# Patient Record
Sex: Female | Born: 1945 | Race: White | Hispanic: No | Marital: Married | Smoking: Never smoker
Health system: Southern US, Community
[De-identification: ages and names within clinical notes are randomized; demographics above are authoritative.]

## PROBLEM LIST (undated history)

## (undated) DIAGNOSIS — M1990 Unspecified osteoarthritis, unspecified site: Secondary | ICD-10-CM

## (undated) DIAGNOSIS — M8580 Other specified disorders of bone density and structure, unspecified site: Secondary | ICD-10-CM

## (undated) DIAGNOSIS — E079 Disorder of thyroid, unspecified: Secondary | ICD-10-CM

## (undated) DIAGNOSIS — F419 Anxiety disorder, unspecified: Secondary | ICD-10-CM

## (undated) DIAGNOSIS — K5790 Diverticulosis of intestine, part unspecified, without perforation or abscess without bleeding: Secondary | ICD-10-CM

## (undated) DIAGNOSIS — I10 Essential (primary) hypertension: Secondary | ICD-10-CM

## (undated) DIAGNOSIS — E785 Hyperlipidemia, unspecified: Secondary | ICD-10-CM

## (undated) DIAGNOSIS — K589 Irritable bowel syndrome without diarrhea: Secondary | ICD-10-CM

## (undated) HISTORY — DX: Hyperlipidemia, unspecified: E78.5

## (undated) HISTORY — DX: Irritable bowel syndrome, unspecified: K58.9

## (undated) HISTORY — DX: Anxiety disorder, unspecified: F41.9

## (undated) HISTORY — DX: Unspecified osteoarthritis, unspecified site: M19.90

## (undated) HISTORY — DX: Essential (primary) hypertension: I10

## (undated) HISTORY — DX: Other specified disorders of bone density and structure, unspecified site: M85.80

## (undated) HISTORY — DX: Disorder of thyroid, unspecified: E07.9

## (undated) HISTORY — DX: Diverticulosis of intestine, part unspecified, without perforation or abscess without bleeding: K57.90

---

## 2000-07-03 ENCOUNTER — Encounter: Payer: Self-pay | Admitting: Family Medicine

## 2000-07-03 ENCOUNTER — Encounter: Admission: RE | Admit: 2000-07-03 | Discharge: 2000-07-03 | Payer: Self-pay | Admitting: Family Medicine

## 2001-02-02 ENCOUNTER — Encounter: Payer: Self-pay | Admitting: Emergency Medicine

## 2001-02-02 ENCOUNTER — Emergency Department (HOSPITAL_COMMUNITY): Admission: EM | Admit: 2001-02-02 | Discharge: 2001-02-03 | Payer: Self-pay | Admitting: Emergency Medicine

## 2001-02-03 ENCOUNTER — Encounter: Payer: Self-pay | Admitting: Emergency Medicine

## 2001-02-27 ENCOUNTER — Encounter (INDEPENDENT_AMBULATORY_CARE_PROVIDER_SITE_OTHER): Payer: Self-pay | Admitting: *Deleted

## 2001-02-27 ENCOUNTER — Encounter: Payer: Self-pay | Admitting: Surgery

## 2001-02-27 ENCOUNTER — Observation Stay (HOSPITAL_COMMUNITY): Admission: RE | Admit: 2001-02-27 | Discharge: 2001-02-28 | Payer: Self-pay | Admitting: Surgery

## 2001-06-18 ENCOUNTER — Other Ambulatory Visit: Admission: RE | Admit: 2001-06-18 | Discharge: 2001-06-18 | Payer: Self-pay | Admitting: Family Medicine

## 2002-10-14 HISTORY — PX: CHOLECYSTECTOMY: SHX55

## 2008-10-26 ENCOUNTER — Ambulatory Visit (HOSPITAL_COMMUNITY): Admission: RE | Admit: 2008-10-26 | Discharge: 2008-10-26 | Payer: Self-pay | Admitting: Family Medicine

## 2009-07-20 ENCOUNTER — Encounter (INDEPENDENT_AMBULATORY_CARE_PROVIDER_SITE_OTHER): Payer: Self-pay | Admitting: *Deleted

## 2009-08-28 ENCOUNTER — Other Ambulatory Visit: Admission: RE | Admit: 2009-08-28 | Discharge: 2009-08-28 | Payer: Self-pay | Admitting: Family Medicine

## 2009-08-28 LAB — HM PAP SMEAR

## 2010-05-30 IMAGING — CR DG KNEE 1-2V*R*
2 series · 2 of 2 positions shown · non-contrast
Comparison: None

CLINICAL DATA: Medial pain

RIGHT KNEE - 1-2 VIEW

[t knee ap right]
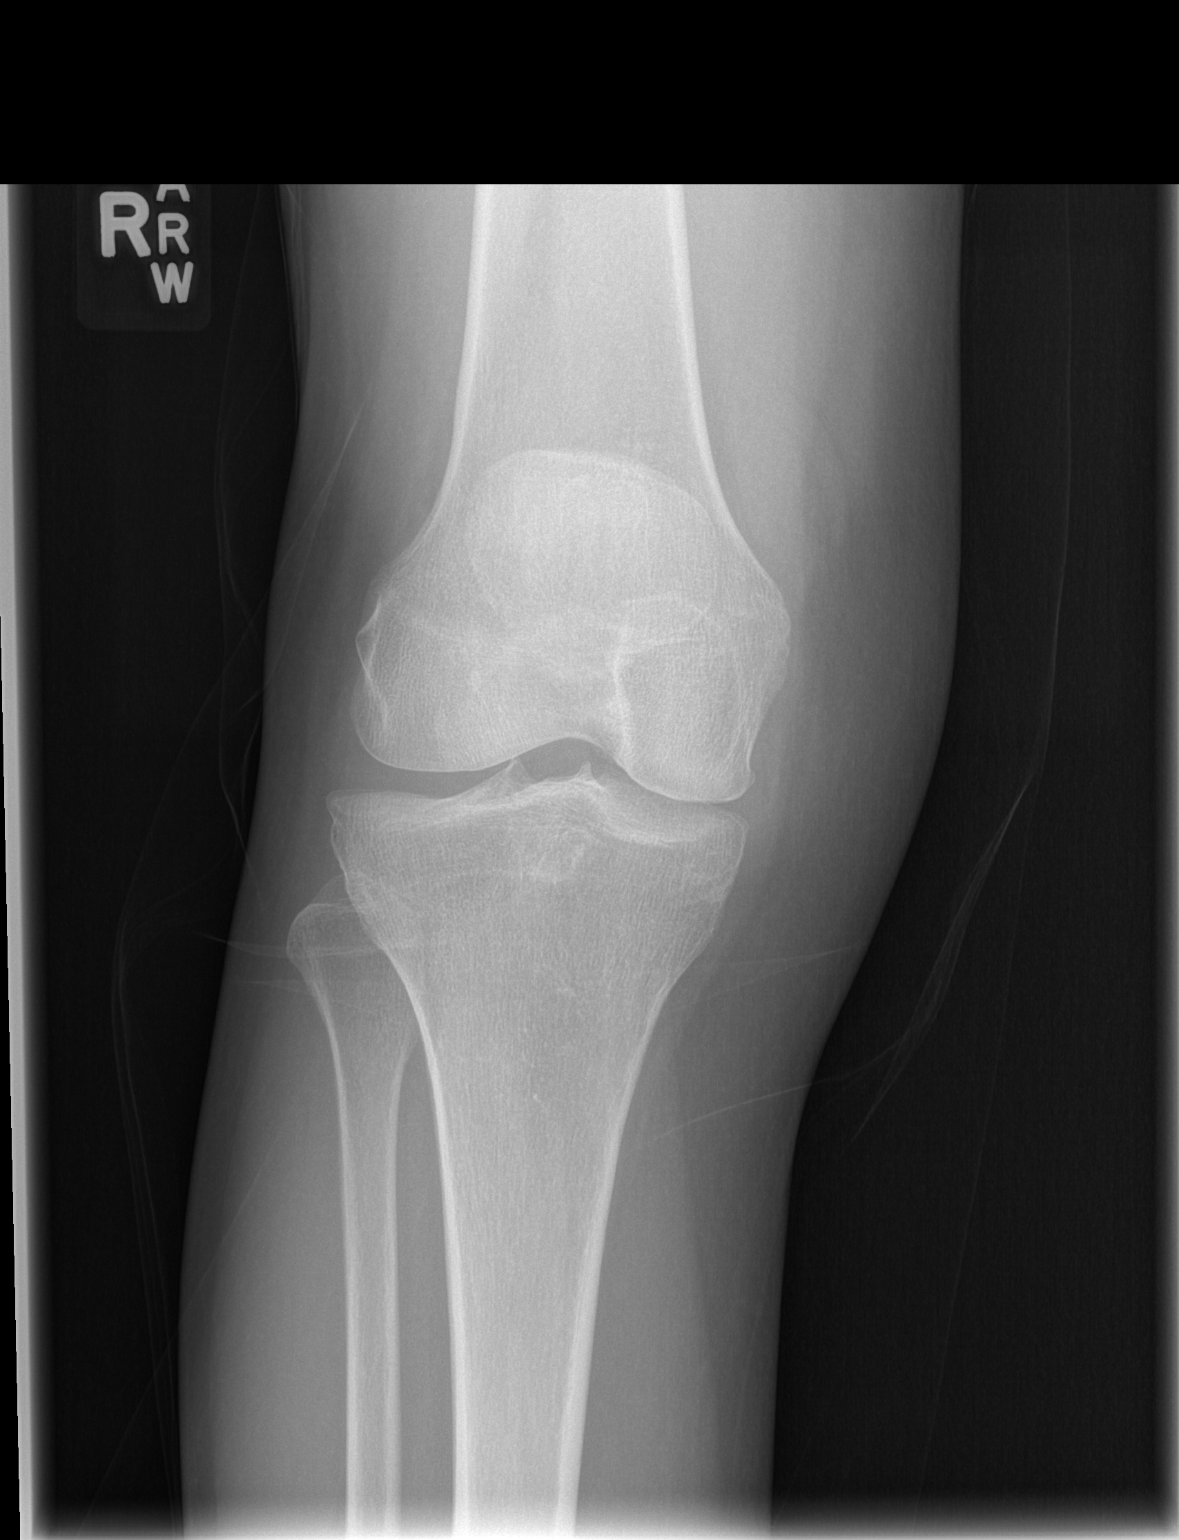

[t knee lat right]
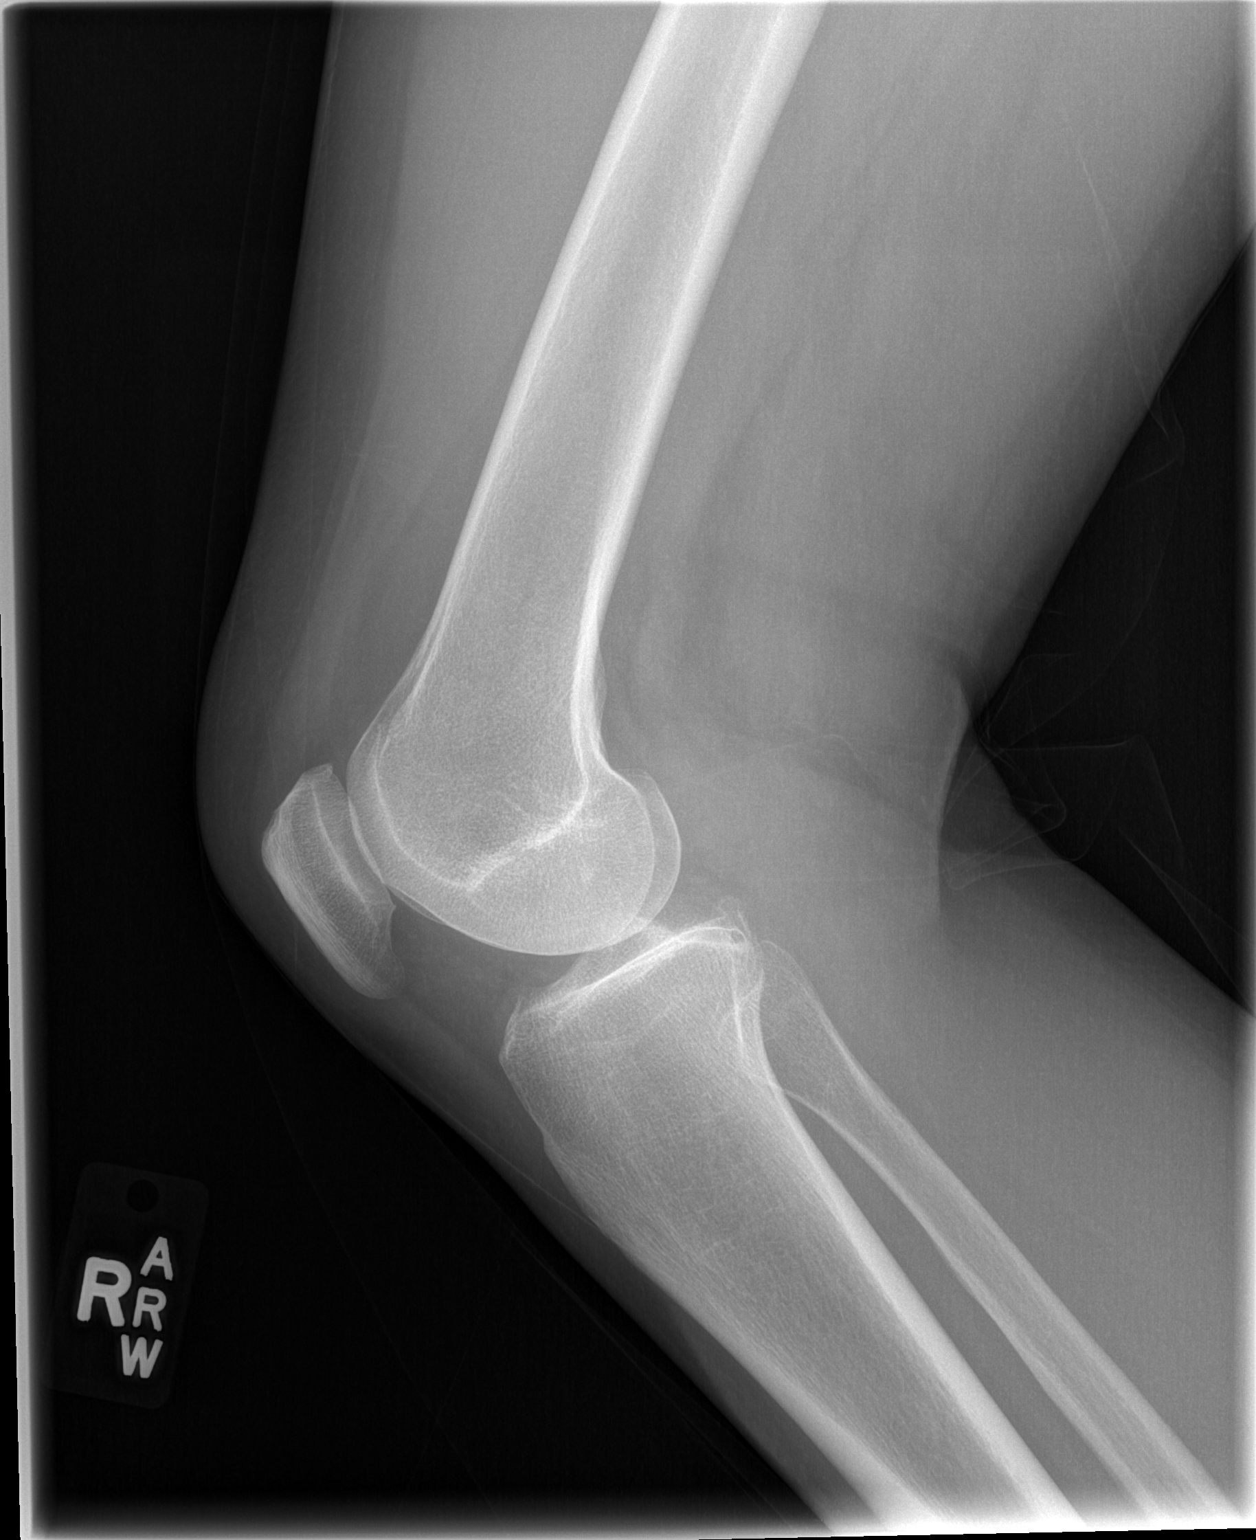

[2 of 2 positions shown; findings below may reference images not displayed]

FINDINGS: No discernible joint effusion.  There is mild medial
compartment joint space narrowing with very small osteophytes.  No
abnormalities seen elsewhere.
IMPRESSION: Mild medial compartment osteoarthritis.

## 2011-03-01 NOTE — Op Note (Signed)
[facility]  Patient:    Andrea Boyd, Andrea Boyd                   MRN: [id number] Proc. Date: 02/27/01 Adm. Date:  [id number] Disc. Date: [id number] Attending:  Katha Cabal CC:         Carolyne Fiscal, M.D.   Operative Report  CCS NUMBER:  [id number]  PREOPERATIVE INDICATIONS:  Andrea Boyd is a 65 year old lady, who was seen in the [facility] Emergency Room on February 02, 2001, complaining of pain under her breasts for about four days.  She was evaluated in the emergency department, and a gallbladder ultrasound was done which showed numerous small gallstones.  She was seen in the office by me on February 09, 2001, and we discussed laparoscopic as well as open cholecystectomy.  Informed consent was obtained, and she is brought to the operating room at this time for laparoscopic cholecystectomy.  SURGEON:  Thornton Park. Daphine Deutscher, M.D.  ASSISTANT:  Sandria Bales. Ezzard Standing, M.D.  PROCEDURE:  Laparoscopic cholecystectomy, intraoperative cholangiogram, biopsy of omental nodule.  FINDINGS:  Intraoperative cholangiogram revealed either a small distal common bile duct stone despite totally normal liver function studies preoperatively or possibly a pseudocalculus sign.  The gallbladder was packed with 3 mm cholesterol stones, making the likelihood of a distal common bile duct stone more plausible.  DESCRIPTION OF PROCEDURE:  [name] was taken to OR #1 on Friday, Feb 27, 2001, and given general anesthesia.  The abdomen was prepped with Betadine and draped sterilely.  I made a longitudinal incision down into the umbilicus and through a small slit and with a pursestring suture around that, I passed the Hasson cannula and insufflated the abdomen.  Three ports were placed in the upper abdomen.  The gallbladder was grasped.  Multiple pictures were taken and given to the patient that showed a marked amount of omentum stuck up around the gallbladder.  This was stripped  away.  The inflammatory changes were chronic and pretty extensive and were subsequently taken down and stripped away from the infundibulum.  The cystic duct was dissected out and a clip placed up on the gallbladder.  I incised the cystic duct and inserted a Reddick catheter and took a dynamic cholangiogram which revealed proximal filling, first of the intrahepatic radicles, followed by distal common bile duct refilling, and with a very long distal common bile duct which was not dilated.  Contrast floated in the duodenum but at the distal end, there appeared to be a filling defect which seemed to reduce with time and then come back on subsequent films which might represent either a pseudocalculus sign generated by the sphincter of Boyden, or becomes contracted, or possibly a small distal common bile duct stone.  The patients liver function profile preoperatively was totally normal.  Her ______ were studied, and Dr. Fredia Sorrow looked at these as well, and it was really inconclusive, although it was felt that there could possibly be a little stone there.  Under the circumstances, I felt that we would go ahead and remove the gallbladder and manage her expectantly.  The cystic duct was triple clipped and divided, and the gallbladder was removed from the gallbladder bed.  The cystic artery was also triple clipped.  The gallbladder was removed, detached.  Again, pictures were taken during the various sequences in this procedure and once detached, the gallbladder bed was inspected, and no bleeding or bile leaks were noted.  The gallbladder was brought out  through the umbilicus.  There were not stones spilled at all during the procedure.  Things appeared to be in order in the right upper quadrant and the site of the gallbladder.  I went ahead and surveyed the abdomen.  In her lower midline, she was developing a ventral eventration. Also noted a little, firm nodule in the omentum which I dissected  free and removed and sent for permanent sections.  There was no bleeding from that biopsy site either.  Again, we looked back at the gallbladder bed, irrigated again, and then looked at the umbilical closure, and that looked good.  I removed the ports in the lateral portion of the abdomen under direct vision and the deflated the abdomen.  The port sites were injected with Marcaine and closed with 4-0 Vicryl with Benzoin and Steri-Strips.  The patient seemed to tolerate the procedure well and was taken to the recovery room in satisfactory condition. DD:  02/27/01 TD:  02/28/01 Job: [id number]

## 2012-07-06 ENCOUNTER — Encounter: Payer: Self-pay | Admitting: Gastroenterology

## 2012-08-11 ENCOUNTER — Ambulatory Visit (AMBULATORY_SURGERY_CENTER): Payer: Medicare Other | Admitting: *Deleted

## 2012-08-11 VITALS — Ht 63.0 in | Wt 126.0 lb

## 2012-08-11 DIAGNOSIS — Z1211 Encounter for screening for malignant neoplasm of colon: Secondary | ICD-10-CM

## 2012-08-11 MED ORDER — MOVIPREP 100 G PO SOLR: ORAL | Status: DC

## 2012-08-25 ENCOUNTER — Ambulatory Visit (AMBULATORY_SURGERY_CENTER): Payer: Medicare Other | Admitting: Gastroenterology

## 2012-08-25 ENCOUNTER — Encounter: Payer: Self-pay | Admitting: Gastroenterology

## 2012-08-25 VITALS — BP 145/64 | HR 48 | Temp 98.1°F | Resp 25 | Ht 63.0 in | Wt 126.0 lb

## 2012-08-25 DIAGNOSIS — Z1211 Encounter for screening for malignant neoplasm of colon: Secondary | ICD-10-CM

## 2012-08-25 MED ORDER — SODIUM CHLORIDE 0.9 % IV SOLN: 500.0000 mL | INTRAVENOUS | Status: DC

## 2012-08-25 NOTE — Progress Notes (Signed)
Pt. States that she drank a cup of black coffee at 0900 this morning.  Took atenol at 0930 with approximately 1/4 cup water.  Dr. Russella Dar and nurse anesth. In room advised.

## 2012-08-25 NOTE — Progress Notes (Signed)
No complaints noted in the recovery room. Maw  Patient did not experience any of the following events: a burn prior to discharge; a fall within the facility; wrong site/side/patient/procedure/implant event; or a hospital transfer or hospital admission upon discharge from the facility. (G8907) Patient did not have preoperative order for IV antibiotic SSI prophylaxis. (G8918)  

## 2012-08-25 NOTE — Op Note (Signed)
[facility] [address] [place], [address]   COLONOSCOPY PROCEDURE REPORT  PATIENT: Andrea Boyd, Andrea Boyd  MR#: [id number] BIRTHDATE: 19-Oct-1945 , 66  yrs. old GENDER: Female ENDOSCOPIST: Meryl Dare, MD, [facility] PROCEDURE DATE:  08/25/2012 PROCEDURE:   Colonoscopy, screening ASA CLASS:   Class II INDICATIONS:average risk screening. MEDICATIONS: MAC sedation, administered by CRNA and propofol (Diprivan) 200mg  IV DESCRIPTION OF PROCEDURE:   After the risks benefits and alternatives of the procedure were thoroughly explained, informed consent was obtained.  A digital rectal exam revealed no abnormalities of the rectum.   The LB [id number]  endoscope was introduced through the anus and advanced to the cecum, which was identified by both the appendix and ileocecal valve. No adverse events experienced.   The quality of the prep was excellent, using MoviPrep  The instrument was then slowly withdrawn as the colon was fully examined.  COLON FINDINGS: Mild diverticulosis was noted in the ascending colon and transverse colon.   Moderate diverticulosis was noted in the descending colon and sigmoid colon.  The colon was otherwise normal.  There was no diverticulosis, inflammation, polyps or cancers unless previously stated.  Retroflexed views revealed no abnormalities. The time to cecum=3 minutes 06 seconds.  Withdrawal time=9 minutes 59 seconds.  The scope was withdrawn and the procedure completed.  COMPLICATIONS: There were no complications.  ENDOSCOPIC IMPRESSION: 1.   Mild diverticulosis was noted in the ascending colon and transverse colon 2.   Moderate diverticulosis was noted in the descending colon and sigmoid colon  RECOMMENDATIONS: 1.  Continue current colorectal screening recommendations for "routine risk" patients with a repeat colonoscopy in 10 years. 2.  High fiber diet with liberal fluid intake.   eSigned:  Meryl Dare, MD, [facility]  08/25/2012 11:15 AM   cc: Reggie Pile, MD

## 2012-08-25 NOTE — Patient Instructions (Addendum)
Handouts were given to your care partner on diverticulosis and high fiber diet with liberal fluid intake.  You may resume your current medications today.  Please call if any questions or concerns.    YOU HAD AN ENDOSCOPIC PROCEDURE TODAY AT THE [facility]: Refer to the procedure report that was given to you for any specific questions about what was found during the examination.  If the procedure report does not answer your questions, please call your gastroenterologist to clarify.  If you requested that your care partner not be given the details of your procedure findings, then the procedure report has been included in a sealed envelope for you to review at your convenience later.  YOU SHOULD EXPECT: Some feelings of bloating in the abdomen. Passage of more gas than usual.  Walking can help get rid of the air that was put into your GI tract during the procedure and reduce the bloating. If you had a lower endoscopy (such as a colonoscopy or flexible sigmoidoscopy) you may notice spotting of blood in your stool or on the toilet paper. If you underwent a bowel prep for your procedure, then you may not have a normal bowel movement for a few days.  DIET: Your first meal following the procedure should be a light meal and then it is ok to progress to your normal diet.  A half-sandwich or bowl of soup is an example of a good first meal.  Heavy or fried foods are harder to digest and may make you feel nauseous or bloated.  Likewise meals heavy in dairy and vegetables can cause extra gas to form and this can also increase the bloating.  Drink plenty of fluids but you should avoid alcoholic beverages for 24 hours.  ACTIVITY: Your care partner should take you home directly after the procedure.  You should plan to take it easy, moving slowly for the rest of the day.  You can resume normal activity the day after the procedure however you should NOT DRIVE or use heavy machinery for 24 hours (because of the  sedation medicines used during the test).    SYMPTOMS TO REPORT IMMEDIATELY: A gastroenterologist can be reached at any hour.  During normal business hours, 8:30 AM to 5:00 PM Monday through Friday, call [phone].  After hours and on weekends, please call the GI answering service at [phone] who will take a message and have the physician on call contact you.   Following lower endoscopy (colonoscopy or flexible sigmoidoscopy):  Excessive amounts of blood in the stool  Significant tenderness or worsening of abdominal pains  Swelling of the abdomen that is new, acute  Fever of 100F or higher   FOLLOW UP: If any biopsies were taken you will be contacted by phone or by letter within the next 1-3 weeks.  Call your gastroenterologist if you have not heard about the biopsies in 3 weeks.  Our staff will call the home number listed on your records the next business day following your procedure to check on you and address any questions or concerns that you may have at that time regarding the information given to you following your procedure. This is a courtesy call and so if there is no answer at the home number and we have not heard from you through the emergency physician on call, we will assume that you have returned to your regular daily activities without incident.  SIGNATURES/CONFIDENTIALITY: You and/or your care partner have signed paperwork which will be  entered into your electronic medical record.  These signatures attest to the fact that that the information above on your After Visit Summary has been reviewed and is understood.  Full responsibility of the confidentiality of this discharge information lies with you and/or your care-partner.

## 2012-08-26 ENCOUNTER — Telehealth: Payer: Self-pay | Admitting: *Deleted

## 2012-08-26 NOTE — Telephone Encounter (Signed)
  Follow up Call-  Call back number 08/25/2012  Post procedure Call Back phone  # [phone]  Permission to leave phone message Yes     Patient questions:  Do you have a fever, pain , or abdominal swelling? no Pain Score  0 *  Have you tolerated food without any problems? yes  Have you been able to return to your normal activities? yes  Do you have any questions about your discharge instructions: Diet   no Medications  no Follow up visit  no  Do you have questions or concerns about your Care? no  Actions: * If pain score is 4 or above: No action needed, pain <4.

## 2015-10-23 ENCOUNTER — Ambulatory Visit: Payer: Self-pay | Admitting: Allergy and Immunology

## 2015-10-31 ENCOUNTER — Ambulatory Visit (INDEPENDENT_AMBULATORY_CARE_PROVIDER_SITE_OTHER): Payer: Medicare Other | Admitting: Allergy and Immunology

## 2015-10-31 ENCOUNTER — Encounter: Payer: Self-pay | Admitting: Allergy and Immunology

## 2015-10-31 VITALS — BP 118/84 | HR 60 | Temp 97.6°F | Resp 16 | Ht 62.99 in | Wt 125.7 lb

## 2015-10-31 DIAGNOSIS — H1045 Other chronic allergic conjunctivitis: Secondary | ICD-10-CM

## 2015-10-31 DIAGNOSIS — H109 Unspecified conjunctivitis: Secondary | ICD-10-CM

## 2015-10-31 NOTE — Progress Notes (Signed)
New Patient Note  RE: Andrea Boyd MRN: [id number] DOB: [birth year] Date of Office Visit: 10/31/2015  Referring provider: Deatra James, MD Primary care provider: Leanor Rubenstein, MD  Chief Complaint: Conjunctivitis   History of present illness: HPI Comments: Andrea Boyd is a 70 y.o. female ho presents today for her initial consultation of conjunctivitis.  She reports that over the past 3 months she has experienced watery/red eyes with crusty discharge in the mornings.  She denies pruritus or pain.  She saw her optometrist approximately 2 months ago and was given antibiotic eyedrops without symptom relief.  She has also tried antihistamine, ketotifen fumarate, ophthalmic drops without perceived symptom reduction.  She is unable to identify any specific triggers.  She does not have a history of allergic conjunctivitis or allergic rhinitis.  She denies nasal or sinus symptoms.   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  Systane, 1-2 drops per eye as needed.  If symptoms persist or progress, ophthalmology consultation may be warranted for further evaluation and treatment.    No orders of the defined types were placed in this encounter.    Diagnositics: Allergy skin testing: Negative despite a positive histamine control.    Physical examination: Blood pressure 118/84, pulse 60, temperature 97.6 F (36.4 C), temperature source Oral, resp. rate 16, height 5' 2.99" (1.6 m), weight 125 lb 10.6 oz (57 kg).  General: Alert, interactive, in no acute distress. HEENT: TMs pearly gray, turbinates non-edematous without discharge, post-pharynx non erythematous. Conjunctival/scleral injection and mild epiphora bilaterally. Neck: Supple without lymphadenopathy. Lungs: Clear to auscultation without wheezing, rhonchi or rales. CV: Normal S1, S2 without murmurs. Abdomen: Nondistended, nontender. Skin: Warm and  dry, without lesions or rashes. Extremities:  No clubbing, cyanosis or edema. Neuro:   Grossly intact.  Review of systems: Review of Systems  Constitutional: Negative for fever, chills and weight loss.  HENT: Negative for congestion, ear pain and nosebleeds.   Eyes: Positive for discharge and redness. Negative for blurred vision, double vision, photophobia and pain.  Respiratory: Negative for hemoptysis, shortness of breath and wheezing.   Cardiovascular: Negative for chest pain.  Gastrointestinal: Negative for diarrhea and constipation.  Genitourinary: Negative for dysuria.  Musculoskeletal: Negative for myalgias and joint pain.  Skin: Negative for itching and rash.  Neurological: Negative for dizziness.  Endo/Heme/Allergies: Does not bruise/bleed easily.    Past medical history: Past Medical History  Diagnosis Date  . Arthritis   . Hyperlipidemia   . Hypertension   . Thyroid disease     hypo  . Osteopenia     Past surgical history: Past Surgical History  Procedure Laterality Date  . Cholecystectomy  2004    Family history: Family History  Problem Relation Age of Onset  . Allergic rhinitis Neg Hx     Social history: Social History   Social History  . Marital Status: Unknown    Spouse Name: N/A  . Number of Children: N/A  . Years of Education: N/A   Occupational History  . Not on file.   Social History Main Topics  . Smoking status: Never Smoker   . Smokeless tobacco: Never Used  . Alcohol Use: 6.0 oz/week    10 Glasses of wine per week  . Drug Use: No  . Sexual Activity: Not on file   Other Topics Concern  . Not on file   Social History Narrative   Environmental History:  Charitie lives in a 72 year old house  with hardwood floors throughout and central air/heat.  There is a cat in the house which has access to her bedroom.  She is a nonsmoker and is not exposed to significant secondhand cigarette smoke.    Medication List       This list is  accurate as of: 10/31/15  5:23 PM.  Always use your most recent med list.               atenolol 50 MG tablet  Commonly known as:  TENORMIN  Take 1 tablet by mouth Daily.     BABY ASPIRIN PO  Take by mouth.     CALCIUM + D3 PO  Take 500 mg by mouth daily.     FISH OIL PO  Take by mouth.     ketotifen 0.025 % ophthalmic solution  Commonly known as:  ZADITOR  1 drop 2 (two) times daily.     levothyroxine 112 MCG tablet  Commonly known as:  SYNTHROID, LEVOTHROID  Take 1 tablet by mouth Daily.     MAGNESIUM PO  Take by mouth.     PROBIOTIC PO  Take by mouth.     simvastatin 40 MG tablet  Commonly known as:  ZOCOR  Take 1 tablet by mouth Daily.     triamterene-hydrochlorothiazide 37.5-25 MG capsule  Commonly known as:  DYAZIDE  Take 1 tablet by mouth Daily.     TURMERIC PO  Take by mouth.     VITAMIN B-12 PO  Take by mouth.     Vitamin C 500 MG Caps  Take by mouth.     VITAMIN E PO  Take by mouth.        Known medication allergies: No Known Allergies  I appreciate the opportunity to take part in this Andrea Boyd's care. Please do not hesitate to contact me with questions.  Sincerely,   R. Jorene Guest, MD

## 2015-10-31 NOTE — Assessment & Plan Note (Addendum)
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  Systane, 1-2 drops per eye as needed.  If symptoms persist or progress, ophthalmology consultation may be warranted for further evaluation and treatment.

## 2015-10-31 NOTE — Patient Instructions (Addendum)
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  Systane, 1-2 drops per eye as needed.  If symptoms persist or progress, ophthalmology consultation may be warranted for further evaluation and treatment.    Return in about 6 months (around 04/29/2016), or if symptoms worsen or fail to improve.

## 2019-12-16 ENCOUNTER — Ambulatory Visit: Payer: Medicare Other | Attending: Internal Medicine

## 2019-12-16 DIAGNOSIS — Z23 Encounter for immunization: Secondary | ICD-10-CM | POA: Insufficient documentation

## 2019-12-16 NOTE — Progress Notes (Signed)
   Covid-19 Vaccination Clinic  Name:  Andrea Boyd    MRN: [id number] DOB: 09-28-1946  12/16/2019  [name] was observed post Covid-19 immunization for 15 minutes without incident. She was provided with Vaccine Information Sheet and instruction to access the V-Safe system.   [name] was instructed to call 911 with any severe reactions post vaccine: Marland Kitchen Difficulty breathing  . Swelling of face and throat  . A fast heartbeat  . A bad rash all over body  . Dizziness and weakness

## 2020-01-12 ENCOUNTER — Ambulatory Visit: Payer: Medicare Other | Attending: Internal Medicine

## 2020-01-12 DIAGNOSIS — Z23 Encounter for immunization: Secondary | ICD-10-CM

## 2020-01-12 NOTE — Progress Notes (Signed)
   Covid-19 Vaccination Clinic  Name:  Andrea Boyd    MRN: [id number] DOB: [birth year]  01/12/2020  [name] was observed post Covid-19 immunization for 15 minutes without incident. She was provided with Vaccine Information Sheet and instruction to access the V-Safe system.   [name] was instructed to call 911 with any severe reactions post vaccine: Marland Kitchen Difficulty breathing  . Swelling of face and throat  . A fast heartbeat  . A bad rash all over body  . Dizziness and weakness   Immunizations Administered    Name Date Dose VIS Date Route   Pfizer COVID-19 Vaccine 01/12/2020  1:58 PM 0.3 mL 09/24/2019 Intramuscular   Manufacturer: [employer], [employer]   Lot: [id number]   NDC: [address]-3

## 2020-10-26 DIAGNOSIS — E7800 Pure hypercholesterolemia, unspecified: Secondary | ICD-10-CM | POA: Diagnosis not present

## 2020-10-26 DIAGNOSIS — I10 Essential (primary) hypertension: Secondary | ICD-10-CM | POA: Diagnosis not present

## 2020-10-26 DIAGNOSIS — E039 Hypothyroidism, unspecified: Secondary | ICD-10-CM | POA: Diagnosis not present

## 2020-11-02 DIAGNOSIS — L719 Rosacea, unspecified: Secondary | ICD-10-CM | POA: Diagnosis not present

## 2020-11-02 DIAGNOSIS — H04123 Dry eye syndrome of bilateral lacrimal glands: Secondary | ICD-10-CM | POA: Diagnosis not present

## 2020-11-02 DIAGNOSIS — H25813 Combined forms of age-related cataract, bilateral: Secondary | ICD-10-CM | POA: Diagnosis not present

## 2020-11-24 DIAGNOSIS — H01005 Unspecified blepharitis left lower eyelid: Secondary | ICD-10-CM | POA: Diagnosis not present

## 2020-12-25 DIAGNOSIS — Z1231 Encounter for screening mammogram for malignant neoplasm of breast: Secondary | ICD-10-CM | POA: Diagnosis not present

## 2021-01-09 DIAGNOSIS — I10 Essential (primary) hypertension: Secondary | ICD-10-CM | POA: Diagnosis not present

## 2021-01-09 DIAGNOSIS — E7800 Pure hypercholesterolemia, unspecified: Secondary | ICD-10-CM | POA: Diagnosis not present

## 2021-01-09 DIAGNOSIS — E039 Hypothyroidism, unspecified: Secondary | ICD-10-CM | POA: Diagnosis not present

## 2021-02-06 DIAGNOSIS — Z1389 Encounter for screening for other disorder: Secondary | ICD-10-CM | POA: Diagnosis not present

## 2021-02-06 DIAGNOSIS — I7300 Raynaud's syndrome without gangrene: Secondary | ICD-10-CM | POA: Diagnosis not present

## 2021-02-06 DIAGNOSIS — Z1159 Encounter for screening for other viral diseases: Secondary | ICD-10-CM | POA: Diagnosis not present

## 2021-02-06 DIAGNOSIS — Z0000 Encounter for general adult medical examination without abnormal findings: Secondary | ICD-10-CM | POA: Diagnosis not present

## 2021-02-06 DIAGNOSIS — E039 Hypothyroidism, unspecified: Secondary | ICD-10-CM | POA: Diagnosis not present

## 2021-02-06 DIAGNOSIS — M8588 Other specified disorders of bone density and structure, other site: Secondary | ICD-10-CM | POA: Diagnosis not present

## 2021-02-06 DIAGNOSIS — L719 Rosacea, unspecified: Secondary | ICD-10-CM | POA: Diagnosis not present

## 2021-02-06 DIAGNOSIS — R7301 Impaired fasting glucose: Secondary | ICD-10-CM | POA: Diagnosis not present

## 2021-02-06 DIAGNOSIS — Z23 Encounter for immunization: Secondary | ICD-10-CM | POA: Diagnosis not present

## 2021-02-06 DIAGNOSIS — I10 Essential (primary) hypertension: Secondary | ICD-10-CM | POA: Diagnosis not present

## 2021-02-06 DIAGNOSIS — E7800 Pure hypercholesterolemia, unspecified: Secondary | ICD-10-CM | POA: Diagnosis not present

## 2021-03-20 DIAGNOSIS — R946 Abnormal results of thyroid function studies: Secondary | ICD-10-CM | POA: Diagnosis not present

## 2021-04-02 DIAGNOSIS — E039 Hypothyroidism, unspecified: Secondary | ICD-10-CM | POA: Diagnosis not present

## 2021-04-02 DIAGNOSIS — E7800 Pure hypercholesterolemia, unspecified: Secondary | ICD-10-CM | POA: Diagnosis not present

## 2021-04-02 DIAGNOSIS — I10 Essential (primary) hypertension: Secondary | ICD-10-CM | POA: Diagnosis not present

## 2021-04-11 DIAGNOSIS — R109 Unspecified abdominal pain: Secondary | ICD-10-CM | POA: Diagnosis not present

## 2021-04-11 DIAGNOSIS — R413 Other amnesia: Secondary | ICD-10-CM | POA: Diagnosis not present

## 2021-04-11 DIAGNOSIS — M4317 Spondylolisthesis, lumbosacral region: Secondary | ICD-10-CM | POA: Diagnosis not present

## 2021-06-19 DIAGNOSIS — I10 Essential (primary) hypertension: Secondary | ICD-10-CM | POA: Diagnosis not present

## 2021-06-19 DIAGNOSIS — E039 Hypothyroidism, unspecified: Secondary | ICD-10-CM | POA: Diagnosis not present

## 2021-06-19 DIAGNOSIS — E7800 Pure hypercholesterolemia, unspecified: Secondary | ICD-10-CM | POA: Diagnosis not present

## 2021-07-25 DIAGNOSIS — R1030 Lower abdominal pain, unspecified: Secondary | ICD-10-CM | POA: Diagnosis not present

## 2021-08-03 DIAGNOSIS — I10 Essential (primary) hypertension: Secondary | ICD-10-CM | POA: Diagnosis not present

## 2021-08-03 DIAGNOSIS — R7303 Prediabetes: Secondary | ICD-10-CM | POA: Diagnosis not present

## 2021-08-03 DIAGNOSIS — R413 Other amnesia: Secondary | ICD-10-CM | POA: Diagnosis not present

## 2021-08-03 DIAGNOSIS — I7300 Raynaud's syndrome without gangrene: Secondary | ICD-10-CM | POA: Diagnosis not present

## 2021-08-03 DIAGNOSIS — Z23 Encounter for immunization: Secondary | ICD-10-CM | POA: Diagnosis not present

## 2021-08-03 DIAGNOSIS — E7800 Pure hypercholesterolemia, unspecified: Secondary | ICD-10-CM | POA: Diagnosis not present

## 2021-09-04 DIAGNOSIS — H04123 Dry eye syndrome of bilateral lacrimal glands: Secondary | ICD-10-CM | POA: Diagnosis not present

## 2021-09-04 DIAGNOSIS — H0102A Squamous blepharitis right eye, upper and lower eyelids: Secondary | ICD-10-CM | POA: Diagnosis not present

## 2021-09-04 DIAGNOSIS — H25813 Combined forms of age-related cataract, bilateral: Secondary | ICD-10-CM | POA: Diagnosis not present

## 2021-09-18 DIAGNOSIS — I10 Essential (primary) hypertension: Secondary | ICD-10-CM | POA: Diagnosis not present

## 2021-09-18 DIAGNOSIS — E7800 Pure hypercholesterolemia, unspecified: Secondary | ICD-10-CM | POA: Diagnosis not present

## 2021-09-18 DIAGNOSIS — E039 Hypothyroidism, unspecified: Secondary | ICD-10-CM | POA: Diagnosis not present

## 2021-10-03 DIAGNOSIS — H0102A Squamous blepharitis right eye, upper and lower eyelids: Secondary | ICD-10-CM | POA: Diagnosis not present

## 2021-10-03 DIAGNOSIS — H16223 Keratoconjunctivitis sicca, not specified as Sjogren's, bilateral: Secondary | ICD-10-CM | POA: Diagnosis not present

## 2021-10-04 DIAGNOSIS — E7800 Pure hypercholesterolemia, unspecified: Secondary | ICD-10-CM | POA: Diagnosis not present

## 2021-10-04 DIAGNOSIS — I10 Essential (primary) hypertension: Secondary | ICD-10-CM | POA: Diagnosis not present

## 2021-11-13 ENCOUNTER — Encounter: Payer: Self-pay | Admitting: Physician Assistant

## 2021-11-29 ENCOUNTER — Other Ambulatory Visit (INDEPENDENT_AMBULATORY_CARE_PROVIDER_SITE_OTHER): Payer: Medicare Other

## 2021-11-29 ENCOUNTER — Ambulatory Visit: Payer: Medicare Other | Admitting: Physician Assistant

## 2021-11-29 ENCOUNTER — Encounter: Payer: Self-pay | Admitting: Physician Assistant

## 2021-11-29 VITALS — BP 110/70 | HR 70 | Ht 63.0 in | Wt 112.0 lb

## 2021-11-29 DIAGNOSIS — R1084 Generalized abdominal pain: Secondary | ICD-10-CM | POA: Diagnosis not present

## 2021-11-29 LAB — CBC WITH DIFFERENTIAL/PLATELET
Basophils Absolute: 0.1 10*3/uL (ref 0.0–0.1)
Basophils Relative: 1.7 % (ref 0.0–3.0)
Eosinophils Absolute: 0 10*3/uL (ref 0.0–0.7)
Eosinophils Relative: 0.5 % (ref 0.0–5.0)
HCT: 41.1 % (ref 36.0–46.0)
Hemoglobin: 13.7 g/dL (ref 12.0–15.0)
Lymphocytes Relative: 18.7 % (ref 12.0–46.0)
Lymphs Abs: 1.1 10*3/uL (ref 0.7–4.0)
MCHC: 33.5 g/dL (ref 30.0–36.0)
MCV: 97.7 fl (ref 78.0–100.0)
Monocytes Absolute: 0.7 10*3/uL (ref 0.1–1.0)
Monocytes Relative: 10.9 % (ref 3.0–12.0)
Neutro Abs: 4.1 10*3/uL (ref 1.4–7.7)
Neutrophils Relative %: 68.2 % (ref 43.0–77.0)
Platelets: 239 10*3/uL (ref 150.0–400.0)
RBC: 4.2 Mil/uL (ref 3.87–5.11)
RDW: 13.4 % (ref 11.5–15.5)
WBC: 6 10*3/uL (ref 4.0–10.5)

## 2021-11-29 LAB — COMPREHENSIVE METABOLIC PANEL
ALT: 14 U/L (ref 0–35)
AST: 19 U/L (ref 0–37)
Albumin: 4.7 g/dL (ref 3.5–5.2)
Alkaline Phosphatase: 48 U/L (ref 39–117)
BUN: 24 mg/dL — ABNORMAL HIGH (ref 6–23)
CO2: 35 mEq/L — ABNORMAL HIGH (ref 19–32)
Calcium: 9.5 mg/dL (ref 8.4–10.5)
Chloride: 97 mEq/L (ref 96–112)
Creatinine, Ser: 0.78 mg/dL (ref 0.40–1.20)
GFR: 73.19 mL/min (ref >60.00)
Glucose, Bld: 113 mg/dL — ABNORMAL HIGH (ref 70–99)
Potassium: 3.4 mEq/L — ABNORMAL LOW (ref 3.5–5.1)
Sodium: 137 mEq/L (ref 135–145)
Total Bilirubin: 0.5 mg/dL (ref 0.2–1.2)
Total Protein: 7.2 g/dL (ref 6.0–8.3)

## 2021-11-29 MED ORDER — FAMOTIDINE 40 MG PO TABS: 40.0000 mg | ORAL_TABLET | Freq: Every day | ORAL | 1 refills | Status: DC

## 2021-11-29 NOTE — Progress Notes (Signed)
Chief Complaint: Abdominal pain  HPI:    Mrs. Yauch is a 76 year old female with a past medical history as listed below including anxiety and IBS, known to Dr. Fuller Plan, who was referred to me by Donald Prose, MD for a complaint of abdominal pain.    08/25/2012 colonoscopy with mild to moderate diverticulosis throughout the colon.  No repeat recommended due to age.    Today, the patient presents to clinic and tells me that over the past couple of months she has been having some lower abdominal discomfort which she describes as an "ache and is annoying".  Tells me it is a constant 7-8/10 that often keeps her awake at night or wakes her up in the evenings.  She was initially started on some medicine by her PCP which she thinks may have been the Hyoscyamine, but it did not help after taking it for a few weeks so she discontinued it.  The only thing that seems to help this pain is to walk.  Describes bowel movements which are daily and soft solid and feeling completely emptied afterwards.  Does tell me she thinks eating spicy food seems to make this pain worse within 15 to 20 minutes.    Denies fever, chills, weight loss, nausea, vomiting, heartburn, reflux or increased bloating or gas.  Denies any medications or supplements.  She has been on treatment for years. No change in urination.     Past Medical History:  Diagnosis Date   Anxiety    Arthritis    Diverticulosis    Hyperlipidemia    Hypertension    IBS (irritable bowel syndrome)    Osteopenia    Thyroid disease    hypo    Past Surgical History:  Procedure Laterality Date   CHOLECYSTECTOMY  10/14/2002   also removed appendix    Current Outpatient Medications  Medication Sig Dispense Refill   Ascorbic Acid (VITAMIN C) 500 MG CAPS Take by mouth.     atenolol (TENORMIN) 50 MG tablet Take 1 tablet by mouth Daily.     BABY ASPIRIN PO Take by mouth.     Calcium Carb-Cholecalciferol (CALCIUM + D3 PO) Take 500 mg by mouth daily.      Cyanocobalamin (VITAMIN B-12 PO) Take by mouth.     levothyroxine (SYNTHROID, LEVOTHROID) 112 MCG tablet Take 1 tablet by mouth Daily.     MAGNESIUM PO Take by mouth.     Omega-3 Fatty Acids (FISH OIL PO) Take by mouth.     Probiotic Product (PROBIOTIC PO) Take by mouth.     simvastatin (ZOCOR) 40 MG tablet Take 1 tablet by mouth Daily.     triamterene-hydrochlorothiazide (DYAZIDE) 37.5-25 MG per capsule Take 1 tablet by mouth Daily.     TURMERIC PO Take by mouth.     VITAMIN E PO Take by mouth.     No current facility-administered medications for this visit.    Allergies as of 11/29/2021   (No Known Allergies)    Family History  Problem Relation Age of Onset   Allergic rhinitis Neg Hx     Social History   Socioeconomic History   Marital status: Unknown    Spouse name: Not on file   Number of children: Not on file   Years of education: Not on file   Highest education level: Not on file  Occupational History   Not on file  Tobacco Use   Smoking status: Never   Smokeless tobacco: Never  Substance and Sexual Activity  Alcohol use: Yes    Alcohol/week: 10.0 standard drinks    Types: 10 Glasses of wine per week   Drug use: No   Sexual activity: Not on file  Other Topics Concern   Not on file  Social History Narrative   Not on file   Social Determinants of Health   Financial Resource Strain: Not on file  Food Insecurity: Not on file  Transportation Needs: Not on file  Physical Activity: Not on file  Stress: Not on file  Social Connections: Not on file  Intimate Partner Violence: Not on file    Review of Systems:    Constitutional: No weight loss, fever or chills Skin: No rash Cardiovascular: No chest pain Respiratory: No SOB  Gastrointestinal: See HPI and otherwise negative Genitourinary: No dysuria  Neurological: No headache, dizziness or syncope Musculoskeletal: No new muscle or joint pain Hematologic: No bleeding  Psychiatric: No history of depression  or anxiety   Physical Exam:  Vital signs: BP 110/70    Pulse 70    Ht 5\' 3"  (1.6 m)    Wt 112 lb (50.8 kg)    BMI 19.84 kg/m    Constitutional:   Pleasant Elderly, thin appearing, Caucasian female appears to be in NAD, Well developed, Well nourished, alert and cooperative Head:  Normocephalic and atraumatic. Eyes:   PEERL, EOMI. No icterus. Conjunctiva pink. Ears:  Normal auditory acuity. Neck:  Supple Throat: Oral cavity and pharynx without inflammation, swelling or lesion.  Respiratory: Respirations even and unlabored. Lungs clear to auscultation bilaterally.   No wheezes, crackles, or rhonchi.  Cardiovascular: Normal S1, S2. No MRG. Regular rate and rhythm. No peripheral edema, cyanosis or pallor.  Gastrointestinal:  Soft, nondistended, moderate suprapubic ttp with deep palpation, No rebound or guarding. Normal bowel sounds. No appreciable masses or hepatomegaly. Rectal:  Not performed.  Msk:  Symmetrical without gross deformities. Without edema, no deformity or joint abnormality.  Neurologic:  Alert and  oriented x4;  grossly normal neurologically.  Skin:   Dry and intact without significant lesions or rashes. Psychiatric: Oriented to person, place and time. Demonstrates good judgement and reason without abnormal affect or behaviors.  No recent labs or imaging.  Assessment: 1.  Abdominal pain: Seems to be mostly in her lower abdomen/suprapubic region with no changes in bowel habits or urination, but does worsen about 15 to 20 minutes after eating spicy foods; seems unlikely but could be referred pain from gastritis versus diverticulitis versus gynecologic origin versus other  Plan: 1.  Ordered CT of the abdomen pelvis with contrast for further evaluation of generalized abdominal pain 2.  Started the patient on Famotidine 40 mg daily to cover for possible gastritis.  #30 with 1 refill.  This was sent to the [facility] center per patient request 3.  If above is not helpful  over the next couple of weeks then can be discontinued 4.  Ordered CBC and CMP today 5.  Patient to follow in clinic per recommendations after labs and imaging above.  Ellouise Newer, PA-C [facility] 11/29/2021, 9:41 AM  Cc: Donald Prose, MD

## 2021-11-29 NOTE — Patient Instructions (Signed)
Your provider has requested that you go to the basement level for lab work before leaving today. Press "B" on the elevator. The lab is located at the first door on the left as you exit the elevator.  We have sent the following medications to your pharmacy for you to pick up at your convenience: Famotidine 40 mg daily.   You have been scheduled for a CT scan of the abdomen and pelvis at [facility] ([address]---this is in the same building as [employer]).   You are scheduled on Friday 12/07/21 at 2:30 pm. You should arrive 15 minutes prior to your appointment time for registration. Please follow the written instructions below on the day of your exam:  WARNING: IF YOU ARE ALLERGIC TO IODINE/X-RAY DYE, PLEASE NOTIFY RADIOLOGY IMMEDIATELY AT [phone]! YOU WILL BE GIVEN A 13 HOUR PREMEDICATION PREP.  1) Do not eat anything after 10:30 am (4 hours prior to your test) 2) You have been given 2 bottles of oral contrast to drink. The solution may taste better if refrigerated, but do NOT add ice or any other liquid to this solution. Shake well before drinking.    Drink 1 bottle of contrast @ 12:30 pm (2 hours prior to your exam)  Drink 1 bottle of contrast @ 1:30 pm (1 hour prior to your exam)  You may take any medications as prescribed with a small amount of water, if necessary. If you take any of the following medications: METFORMIN, GLUCOPHAGE, GLUCOVANCE, AVANDAMET, RIOMET, FORTAMET, [place] MET, JANUMET, GLUMETZA or METAGLIP, you MAY be asked to HOLD this medication 48 hours AFTER the exam.  The purpose of you drinking the oral contrast is to aid in the visualization of your intestinal tract. The contrast solution may cause some diarrhea. Depending on your individual set of symptoms, you may also receive an intravenous injection of x-ray contrast/dye. Plan on being at [facility] for 30 minutes or longer, depending on the type of exam you are having  performed.  This test typically takes 30-45 minutes to complete.  If you have any questions regarding your exam or if you need to reschedule, you may call the CT department at [phone] between the hours of 8:00 am and 5:00 pm, Monday-Friday.  ___________________________________________________________________

## 2021-11-30 ENCOUNTER — Telehealth: Payer: Self-pay | Admitting: Physician Assistant

## 2021-11-30 NOTE — Telephone Encounter (Signed)
Patient returned your phone call.

## 2021-11-30 NOTE — Telephone Encounter (Signed)
Addressed in previously created encounter 

## 2021-12-07 ENCOUNTER — Ambulatory Visit (INDEPENDENT_AMBULATORY_CARE_PROVIDER_SITE_OTHER)
Admission: RE | Admit: 2021-12-07 | Discharge: 2021-12-07 | Disposition: A | Discharge status: Home / Self Care | Payer: Medicare Other | Source: Ambulatory Visit | Attending: Physician Assistant | Admitting: Physician Assistant

## 2021-12-07 ENCOUNTER — Other Ambulatory Visit: Payer: Self-pay

## 2021-12-07 DIAGNOSIS — I700 Atherosclerosis of aorta: Secondary | ICD-10-CM | POA: Diagnosis not present

## 2021-12-07 DIAGNOSIS — R1084 Generalized abdominal pain: Secondary | ICD-10-CM | POA: Diagnosis not present

## 2021-12-07 DIAGNOSIS — R109 Unspecified abdominal pain: Secondary | ICD-10-CM | POA: Diagnosis not present

## 2021-12-07 MED ORDER — IOHEXOL 300 MG/ML  SOLN
100.0000 mL | Freq: Once | INTRAMUSCULAR | Status: AC | PRN
  Administered 2021-12-07: 80 mL via INTRAVENOUS

## 2021-12-31 DIAGNOSIS — Z1231 Encounter for screening mammogram for malignant neoplasm of breast: Secondary | ICD-10-CM | POA: Diagnosis not present

## 2022-01-02 DIAGNOSIS — H04123 Dry eye syndrome of bilateral lacrimal glands: Secondary | ICD-10-CM | POA: Diagnosis not present

## 2022-02-25 DIAGNOSIS — R109 Unspecified abdominal pain: Secondary | ICD-10-CM | POA: Diagnosis not present

## 2022-02-25 DIAGNOSIS — E7800 Pure hypercholesterolemia, unspecified: Secondary | ICD-10-CM | POA: Diagnosis not present

## 2022-02-25 DIAGNOSIS — L719 Rosacea, unspecified: Secondary | ICD-10-CM | POA: Diagnosis not present

## 2022-02-25 DIAGNOSIS — Z0000 Encounter for general adult medical examination without abnormal findings: Secondary | ICD-10-CM | POA: Diagnosis not present

## 2022-02-25 DIAGNOSIS — M8588 Other specified disorders of bone density and structure, other site: Secondary | ICD-10-CM | POA: Diagnosis not present

## 2022-02-25 DIAGNOSIS — R7303 Prediabetes: Secondary | ICD-10-CM | POA: Diagnosis not present

## 2022-02-25 DIAGNOSIS — Z1211 Encounter for screening for malignant neoplasm of colon: Secondary | ICD-10-CM | POA: Diagnosis not present

## 2022-02-25 DIAGNOSIS — E039 Hypothyroidism, unspecified: Secondary | ICD-10-CM | POA: Diagnosis not present

## 2022-02-25 DIAGNOSIS — I10 Essential (primary) hypertension: Secondary | ICD-10-CM | POA: Diagnosis not present

## 2022-03-08 DIAGNOSIS — M8589 Other specified disorders of bone density and structure, multiple sites: Secondary | ICD-10-CM | POA: Diagnosis not present

## 2022-03-08 DIAGNOSIS — Z780 Asymptomatic menopausal state: Secondary | ICD-10-CM | POA: Diagnosis not present

## 2022-03-08 LAB — HM DEXA SCAN

## 2022-05-14 ENCOUNTER — Ambulatory Visit: Payer: Medicare Other | Admitting: Physician Assistant

## 2022-05-14 ENCOUNTER — Encounter: Payer: Self-pay | Admitting: Physician Assistant

## 2022-05-14 VITALS — BP 122/80 | HR 74 | Ht 63.0 in | Wt 117.0 lb

## 2022-05-14 DIAGNOSIS — R1030 Lower abdominal pain, unspecified: Secondary | ICD-10-CM | POA: Diagnosis not present

## 2022-05-14 NOTE — Patient Instructions (Addendum)
If you are age 78 or older, your body mass index should be between 23-30. Your Body mass index is 20.73 kg/m. If this is out of the aforementioned range listed, please consider follow up with your Primary Care Provider.  If you are age 53 or younger, your body mass index should be between 19-25. Your Body mass index is 20.73 kg/m. If this is out of the aformentioned range listed, please consider follow up with your Primary Care Provider.   IB Gard 2 capsules twice daily for 1 month , then one capsule one capsule twice daily.   The [place] [facility] providers would like to encourage you to use [facility] to communicate with providers for non-urgent requests or questions.  Due to long hold times on the telephone, sending your provider a message by [facility] may be a faster and more efficient way to get a response.  Please allow 48 business hours for a response.  Please remember that this is for non-urgent requests.   It was a pleasure to see you today!  Thank you for trusting me with your gastrointestinal care!    Hyacinth Meeker, PA-C

## 2022-05-14 NOTE — Progress Notes (Signed)
Chief Complaint: Lower abdominal pain  HPI:    Andrea Boyd is a 76 year old female with a past medical history as listed below including IBS, known to Dr. Russella Dar, who returns to clinic today for follow-up of lower abdominal pain.    08/25/2012 colonoscopy with mild to moderate diverticulosis throughout the colon.  No repeat recommended due to age.    11/29/2021 patient seen in clinic and described a constant pain that sometimes even will occur in the evenings that got better with walking around.  At that time start the patient on Famotidine as she said this pain increased with certain foods as well.  She had previously tried Hyoscyamine which did not work.  Also ordered a CT of the abdomen pelvis and a CBC/CMP.  CBC was normal.  CMP with a minimally decreased potassium at 3.4 and glucose elevated 113 otherwise normal.    12/08/2021 CT of the abdomen pelvis with contrast with diffuse colonic diverticulosis, diffuse aortic atherosclerosis, scoliosis and no other acute findings.    Today, patient tells me that she is no better after using the Famotidine.  She continues with a constant 2-3/10 pain in her lower abdomen which is just constant and dull.  Sometimes worsens but is not currently waking her from her sleep.  She previously tried Hyoscyamine which was not helping either.  Does tell me that walking around seems to help as well as sometimes an ice pack.  She admits that she goes to the gym daily but takes part in dancing classes.  She tells me its not a typical "muscle soreness" feeling.  It is really suprapubic.  No change with urination.  Having normal bowel movements.    Denies fever, chills, weight loss or blood in her stool.     Past Medical History:  Diagnosis Date   Anxiety    Arthritis    Diverticulosis    Hyperlipidemia    Hypertension    IBS (irritable bowel syndrome)    Osteopenia    Thyroid disease    hypo    Past Surgical History:  Procedure Laterality Date   CHOLECYSTECTOMY   10/14/2002   also removed appendix    Current Outpatient Medications  Medication Sig Dispense Refill   atenolol (TENORMIN) 50 MG tablet Take 1 tablet by mouth Daily.     Calcium Carb-Cholecalciferol (CALCIUM + D3 PO) Take 500 mg by mouth daily.     famotidine (PEPCID) 40 MG tablet Take 1 tablet (40 mg total) by mouth daily. 30 tablet 1   levothyroxine (SYNTHROID, LEVOTHROID) 112 MCG tablet Take 1 tablet by mouth Daily.     MAGNESIUM PO Take by mouth.     Omega-3 Fatty Acids (FISH OIL PO) Take by mouth.     Probiotic Product (PROBIOTIC PO) Take by mouth.     simvastatin (ZOCOR) 40 MG tablet Take 1 tablet by mouth Daily.     triamterene-hydrochlorothiazide (DYAZIDE) 37.5-25 MG per capsule Take 1 tablet by mouth Daily.     TURMERIC PO Take by mouth.     VITAMIN E PO Take by mouth.     Ascorbic Acid (VITAMIN C) 500 MG CAPS Take by mouth. (Patient not taking: Reported on 05/14/2022)     BABY ASPIRIN PO Take by mouth. (Patient not taking: Reported on 05/14/2022)     Cyanocobalamin (VITAMIN B-12 PO) Take by mouth. (Patient not taking: Reported on 05/14/2022)     No current facility-administered medications for this visit.    Allergies as of  05/14/2022   (No Known Allergies)    Family History  Problem Relation Age of Onset   Breast cancer Mother    Colon cancer Mother    Diabetes Mother    Heart disease Father    Allergic rhinitis Neg Hx     Social History   Socioeconomic History   Marital status: Married    Spouse name: Not on file   Number of children: 2   Years of education: Not on file   Highest education level: Not on file  Occupational History   Occupation: retired  Tobacco Use   Smoking status: Never   Smokeless tobacco: Never  Substance and Sexual Activity   Alcohol use: Not Currently    Comment: daily, 1/2 glass   Drug use: No   Sexual activity: Not on file  Other Topics Concern   Not on file  Social History Narrative   Not on file   Social Determinants of  Health   Financial Resource Strain: Not on file  Food Insecurity: Not on file  Transportation Needs: Not on file  Physical Activity: Not on file  Stress: Not on file  Social Connections: Not on file  Intimate Partner Violence: Not on file    Review of Systems:    Constitutional: No weight loss, fever or chills Cardiovascular: No chest pain   Respiratory: No SOB  Gastrointestinal: See HPI and otherwise negative   Physical Exam:  Vital signs: BP 122/80   Pulse 74   Ht 5\' 3"  (1.6 m)   Wt 117 lb (53.1 kg)   BMI 20.73 kg/m    Constitutional:   Pleasant Elderly Caucasian female appears to be in NAD, Well developed, Well nourished, alert and cooperative Respiratory: Respirations even and unlabored. Lungs clear to auscultation bilaterally.   No wheezes, crackles, or rhonchi.  Cardiovascular: Normal S1, S2. No MRG. Regular rate and rhythm. No peripheral edema, cyanosis or pallor.  Gastrointestinal:  Soft, nondistended, mild suprapubic ttp. No rebound or guarding. Normal bowel sounds. No appreciable masses or hepatomegaly. Rectal:  Not performed.  Psychiatric: Oriented to person, place and time. Demonstrates good judgement and reason without abnormal affect or behaviors.  RELEVANT LABS AND IMAGING: CBC    Component Value Date/Time   WBC 6.0 11/29/2021 1013   RBC 4.20 11/29/2021 1013   HGB 13.7 11/29/2021 1013   HCT 41.1 11/29/2021 1013   PLT 239.0 11/29/2021 1013   MCV 97.7 11/29/2021 1013   MCHC 33.5 11/29/2021 1013   RDW 13.4 11/29/2021 1013   LYMPHSABS 1.1 11/29/2021 1013   MONOABS 0.7 11/29/2021 1013   EOSABS 0.0 11/29/2021 1013   BASOSABS 0.1 11/29/2021 1013    CMP     Component Value Date/Time   NA 137 11/29/2021 1013   K 3.4 (L) 11/29/2021 1013   CL 97 11/29/2021 1013   CO2 35 (H) 11/29/2021 1013   GLUCOSE 113 (H) 11/29/2021 1013   BUN 24 (H) 11/29/2021 1013   CREATININE 0.78 11/29/2021 1013   CALCIUM 9.5 11/29/2021 1013   PROT 7.2 11/29/2021 1013   ALBUMIN  4.7 11/29/2021 1013   AST 19 11/29/2021 1013   ALT 14 11/29/2021 1013   ALKPHOS 48 11/29/2021 1013   BILITOT 0.5 11/29/2021 1013    Assessment: 1.  Lower abdominal pain: Seemed worse after eating but no help from Famotidine, history of IBS but no help from Hyoscyamine, does help with walking around an ice pack, possibly musculoskeletal or, recently CT abdomen pelvis was negative for  cause; consider musculoskeletal versus gynecological etiology  Plan: 1.  Discontinue Famotidine and Hyoscyamine as these are not helping. 2.  We will try IBgard 2 capsules twice daily for a month and then 1 capsule twice daily. 3.  Discussed with patient that if ice packs and walking help it sounds more musculoskeletal in nature.  Possibly related to scoliosis? 4.  Due to this being a suprapubic discomfort though would recommend that she follow-up with OB/GYN if the IBgard does not help to rule out any gynecological cause. 5.  Otherwise patient will follow with PCP in the future. 6.  Patient follow with [place] as needed.  Hyacinth Meeker, PA-C  Gastroenterology 05/14/2022, 9:31 AM  Cc: Deatra James, MD

## 2022-05-22 DIAGNOSIS — H04123 Dry eye syndrome of bilateral lacrimal glands: Secondary | ICD-10-CM | POA: Diagnosis not present

## 2022-05-22 DIAGNOSIS — H10823 Rosacea conjunctivitis, bilateral: Secondary | ICD-10-CM | POA: Diagnosis not present

## 2022-06-06 DIAGNOSIS — H10823 Rosacea conjunctivitis, bilateral: Secondary | ICD-10-CM | POA: Diagnosis not present

## 2022-06-06 DIAGNOSIS — H04123 Dry eye syndrome of bilateral lacrimal glands: Secondary | ICD-10-CM | POA: Diagnosis not present

## 2022-06-06 DIAGNOSIS — H25813 Combined forms of age-related cataract, bilateral: Secondary | ICD-10-CM | POA: Diagnosis not present

## 2022-08-28 DIAGNOSIS — E7800 Pure hypercholesterolemia, unspecified: Secondary | ICD-10-CM | POA: Diagnosis not present

## 2022-08-28 DIAGNOSIS — Z23 Encounter for immunization: Secondary | ICD-10-CM | POA: Diagnosis not present

## 2022-08-28 DIAGNOSIS — I10 Essential (primary) hypertension: Secondary | ICD-10-CM | POA: Diagnosis not present

## 2022-08-28 DIAGNOSIS — R7301 Impaired fasting glucose: Secondary | ICD-10-CM | POA: Diagnosis not present

## 2022-09-04 ENCOUNTER — Encounter: Payer: Self-pay | Admitting: Gastroenterology

## 2022-09-04 DIAGNOSIS — H04123 Dry eye syndrome of bilateral lacrimal glands: Secondary | ICD-10-CM | POA: Diagnosis not present

## 2022-09-04 DIAGNOSIS — H10823 Rosacea conjunctivitis, bilateral: Secondary | ICD-10-CM | POA: Diagnosis not present

## 2022-09-04 DIAGNOSIS — H25813 Combined forms of age-related cataract, bilateral: Secondary | ICD-10-CM | POA: Diagnosis not present

## 2022-10-03 DIAGNOSIS — H04123 Dry eye syndrome of bilateral lacrimal glands: Secondary | ICD-10-CM | POA: Diagnosis not present

## 2022-10-03 DIAGNOSIS — H10823 Rosacea conjunctivitis, bilateral: Secondary | ICD-10-CM | POA: Diagnosis not present

## 2022-10-21 ENCOUNTER — Ambulatory Visit: Payer: Medicare Other | Admitting: Gastroenterology

## 2022-10-21 DIAGNOSIS — M5416 Radiculopathy, lumbar region: Secondary | ICD-10-CM | POA: Diagnosis not present

## 2022-12-18 DIAGNOSIS — M5441 Lumbago with sciatica, right side: Secondary | ICD-10-CM | POA: Diagnosis not present

## 2022-12-20 ENCOUNTER — Other Ambulatory Visit: Payer: Self-pay | Admitting: Sports Medicine

## 2022-12-20 ENCOUNTER — Ambulatory Visit
Admission: RE | Admit: 2022-12-20 | Discharge: 2022-12-20 | Disposition: A | Discharge status: Home / Self Care | Payer: Medicare Other | Source: Ambulatory Visit | Attending: Sports Medicine | Admitting: Sports Medicine

## 2022-12-20 DIAGNOSIS — M4126 Other idiopathic scoliosis, lumbar region: Secondary | ICD-10-CM | POA: Diagnosis not present

## 2022-12-20 DIAGNOSIS — M549 Dorsalgia, unspecified: Secondary | ICD-10-CM

## 2022-12-20 DIAGNOSIS — M545 Low back pain, unspecified: Secondary | ICD-10-CM | POA: Diagnosis not present

## 2022-12-20 DIAGNOSIS — M25551 Pain in right hip: Secondary | ICD-10-CM | POA: Diagnosis not present

## 2023-01-06 DIAGNOSIS — Z1231 Encounter for screening mammogram for malignant neoplasm of breast: Secondary | ICD-10-CM | POA: Diagnosis not present

## 2023-01-21 DIAGNOSIS — M5416 Radiculopathy, lumbar region: Secondary | ICD-10-CM | POA: Diagnosis not present

## 2023-02-03 DIAGNOSIS — M5416 Radiculopathy, lumbar region: Secondary | ICD-10-CM | POA: Diagnosis not present

## 2023-02-12 DIAGNOSIS — M5416 Radiculopathy, lumbar region: Secondary | ICD-10-CM | POA: Diagnosis not present

## 2023-02-19 DIAGNOSIS — M5416 Radiculopathy, lumbar region: Secondary | ICD-10-CM | POA: Diagnosis not present

## 2023-02-26 DIAGNOSIS — M5416 Radiculopathy, lumbar region: Secondary | ICD-10-CM | POA: Diagnosis not present

## 2023-03-11 DIAGNOSIS — M8588 Other specified disorders of bone density and structure, other site: Secondary | ICD-10-CM | POA: Diagnosis not present

## 2023-03-11 DIAGNOSIS — Z0000 Encounter for general adult medical examination without abnormal findings: Secondary | ICD-10-CM | POA: Diagnosis not present

## 2023-03-11 DIAGNOSIS — I700 Atherosclerosis of aorta: Secondary | ICD-10-CM | POA: Diagnosis not present

## 2023-03-11 DIAGNOSIS — E7800 Pure hypercholesterolemia, unspecified: Secondary | ICD-10-CM | POA: Diagnosis not present

## 2023-03-11 DIAGNOSIS — R7303 Prediabetes: Secondary | ICD-10-CM | POA: Diagnosis not present

## 2023-03-11 DIAGNOSIS — L719 Rosacea, unspecified: Secondary | ICD-10-CM | POA: Diagnosis not present

## 2023-03-11 DIAGNOSIS — I7300 Raynaud's syndrome without gangrene: Secondary | ICD-10-CM | POA: Diagnosis not present

## 2023-03-11 DIAGNOSIS — E039 Hypothyroidism, unspecified: Secondary | ICD-10-CM | POA: Diagnosis not present

## 2023-03-11 DIAGNOSIS — I10 Essential (primary) hypertension: Secondary | ICD-10-CM | POA: Diagnosis not present

## 2023-05-06 DIAGNOSIS — E7800 Pure hypercholesterolemia, unspecified: Secondary | ICD-10-CM | POA: Diagnosis not present

## 2023-05-06 DIAGNOSIS — E039 Hypothyroidism, unspecified: Secondary | ICD-10-CM | POA: Diagnosis not present

## 2023-05-30 DIAGNOSIS — R1030 Lower abdominal pain, unspecified: Secondary | ICD-10-CM | POA: Diagnosis not present

## 2023-07-11 IMAGING — CT CT ABD-PELV W/ CM
2 of 5 series · 16 of 46 positions shown, 18 images · IV contrast (OMNIPAQUE 300)
Comparison: None.

CLINICAL DATA: Abdominal pain, acute, nonlocalized gen abd pain

EXAM:
CT ABDOMEN AND PELVIS WITH CONTRAST
TECHNIQUE: Multidetector CT imaging of the abdomen and pelvis was performed
using the standard protocol following bolus administration of
intravenous contrast.

[Series 2: abd/pel w · axial · 0.61mm/px · z∈[-386,-61]mm · 13 of 75 slices shown, 15 images]
[im 5/75  soft-tissue]
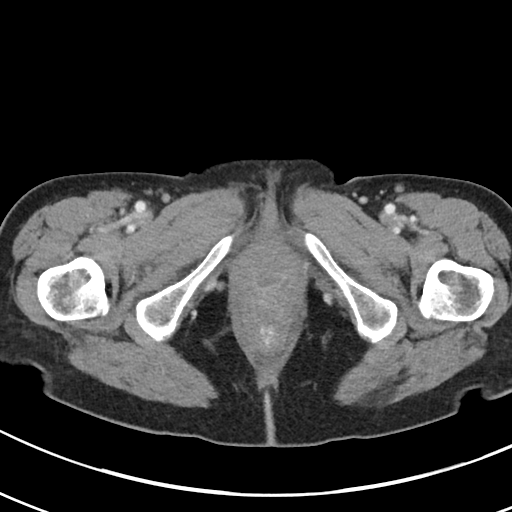
[im 5/75  bone]
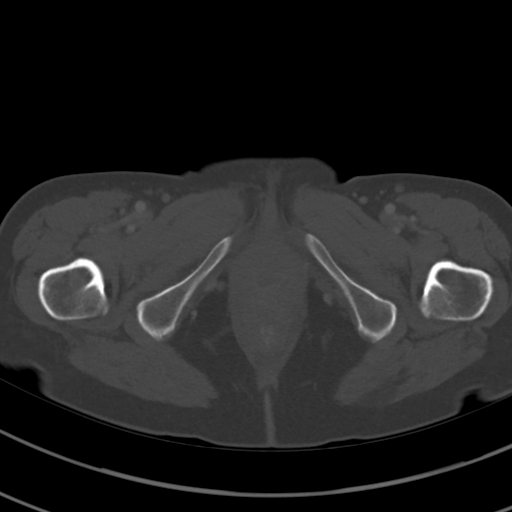
[im 9/75  soft-tissue]
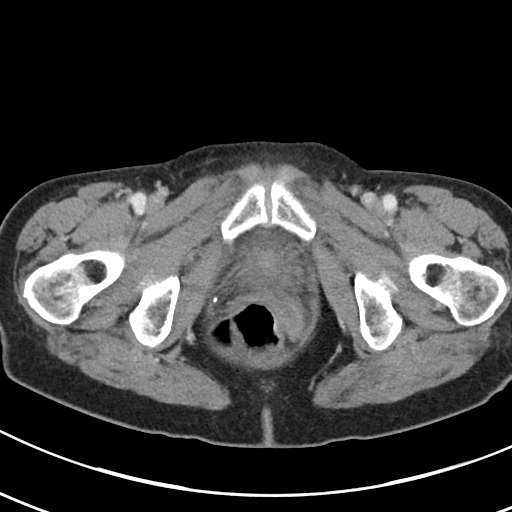
[im 18/75  soft-tissue]
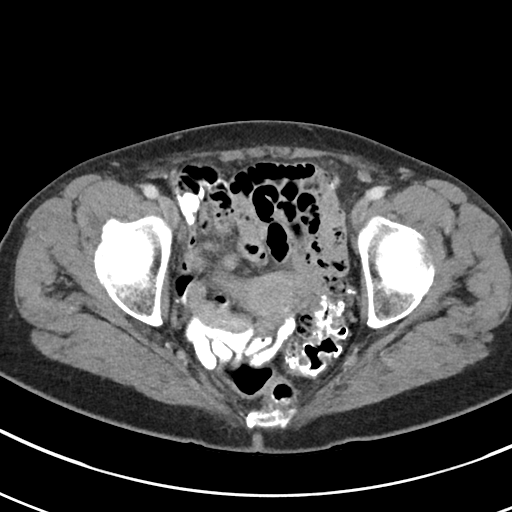
[im 22/75  soft-tissue]
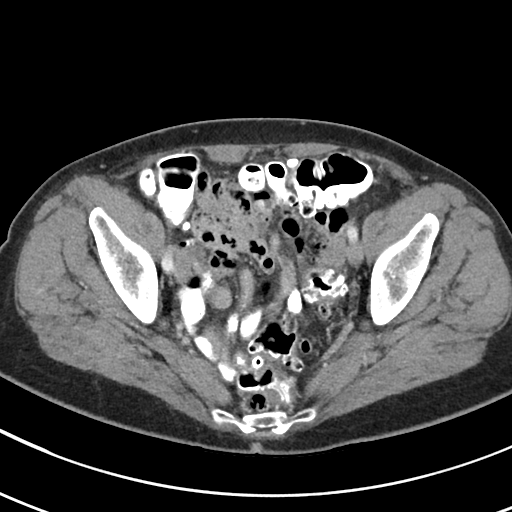
[im 27/75  soft-tissue]
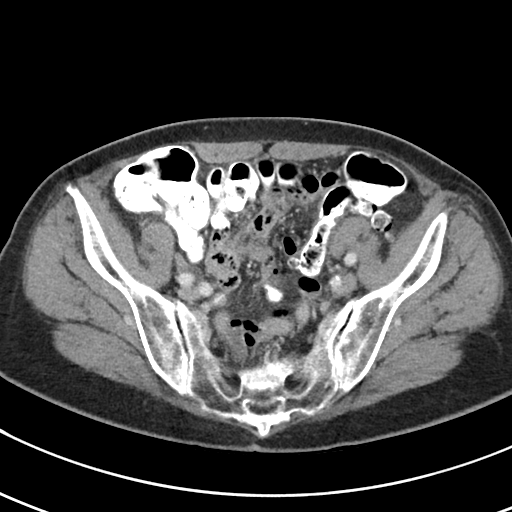
[im 31/75  soft-tissue]
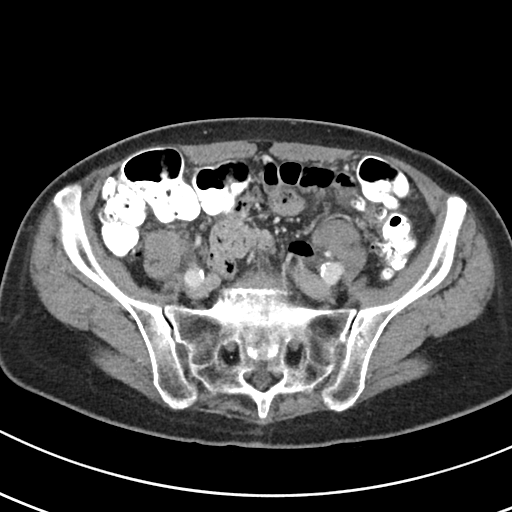
[im 40/75  soft-tissue]
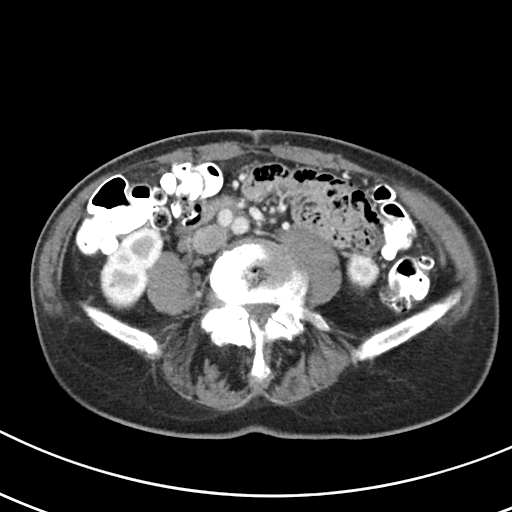
[im 44/75  soft-tissue]
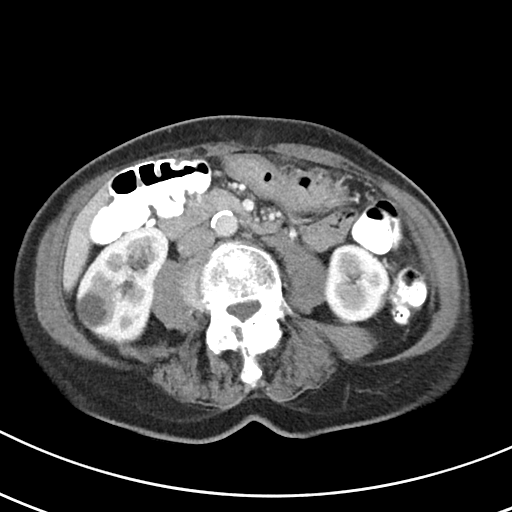
[im 48/75  soft-tissue]
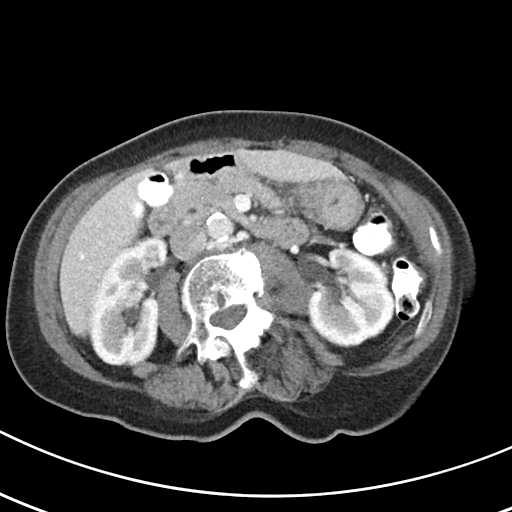
[im 48/75  bone]
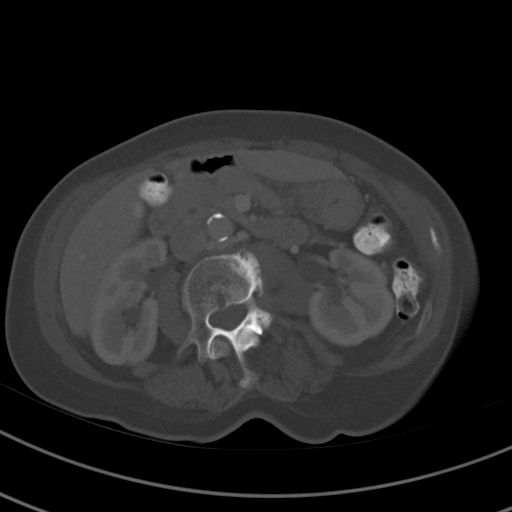
[im 53/75  soft-tissue]
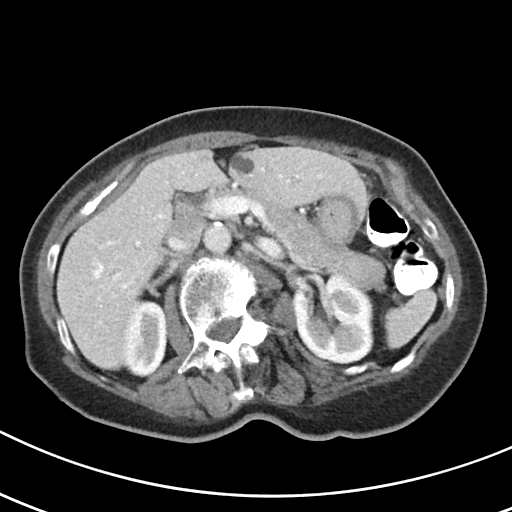
[im 57/75  soft-tissue]
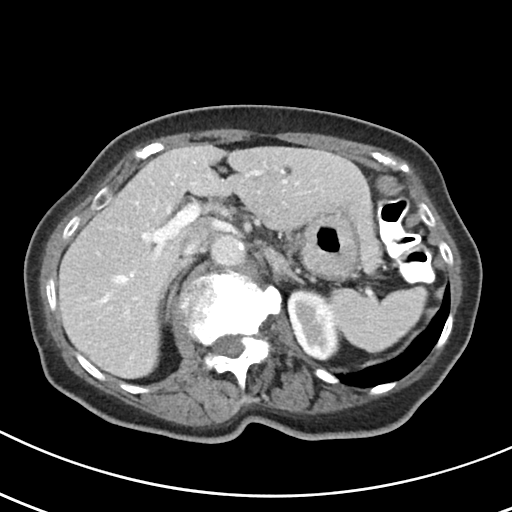
[im 66/75  soft-tissue]
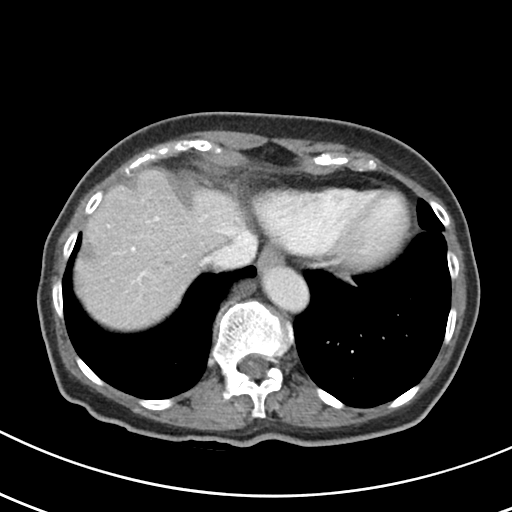
[im 70/75  soft-tissue]
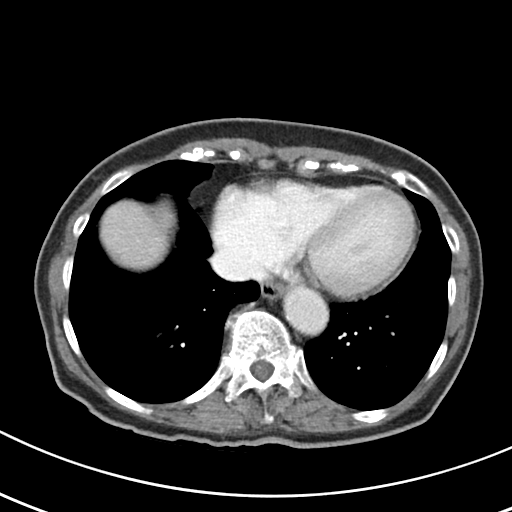

[Series 5: coronal st · coronal · 0.60mm/px · 3 of 63 slices shown]
[im 21/63  soft-tissue]
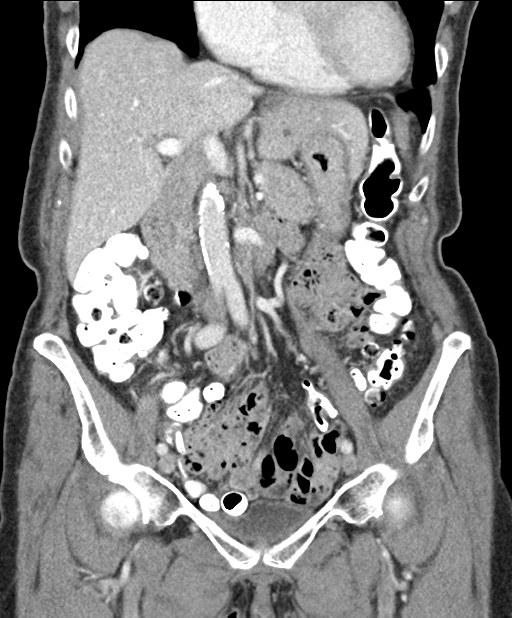
[im 28/63  soft-tissue]
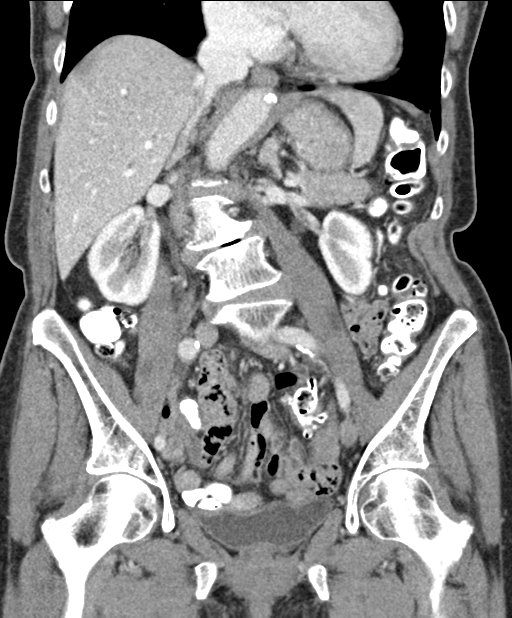
[im 35/63  soft-tissue]
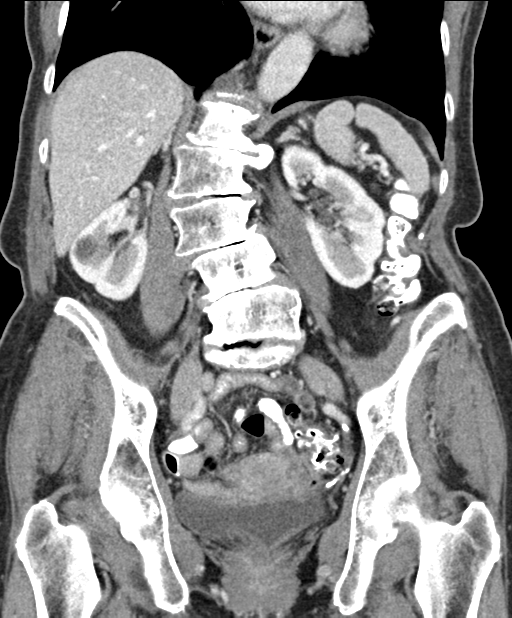

[16 of 46 positions shown; findings below may reference images not displayed]

RADIATION DOSE REDUCTION: This exam was performed according to the
departmental dose-optimization program which includes automated
exposure control, adjustment of the mA and/or kV according to
patient size and/or use of iterative reconstruction technique.

CONTRAST:  80mL OMNIPAQUE IOHEXOL 300 MG/ML  SOLN
FINDINGS: Lower chest: No acute findings

Hepatobiliary: Scattered hypodensities throughout the liver
compatible with cysts, the largest in the left hepatic lobe
measuring 13 mm. Prior cholecystectomy.

Pancreas: No focal abnormality or ductal dilatation.

Spleen: No focal abnormality.  Normal size.

Adrenals/Urinary Tract: 1.9 cm cyst in the mid pole of the right
kidney. No stones or hydronephrosis. Adrenal glands and urinary
bladder unremarkable.

Stomach/Bowel: Diffuse extensive colonic diverticulosis. No active
diverticulitis. Stomach and small bowel decompressed. No bowel
obstruction.

Vascular/Lymphatic: Aortic atherosclerosis. No evidence of aneurysm
or adenopathy.

Reproductive: Uterus and adnexa unremarkable.  No mass.

Other: No free fluid or free air.

Musculoskeletal: No acute bony abnormality. Scoliosis and
degenerative changes in the lumbar spine.
IMPRESSION: Diffuse colonic diverticulosis.  No active diverticulitis.

Diffuse aortic atherosclerosis.

No acute findings.

## 2023-08-05 DIAGNOSIS — Z23 Encounter for immunization: Secondary | ICD-10-CM | POA: Diagnosis not present

## 2023-08-05 DIAGNOSIS — R1030 Lower abdominal pain, unspecified: Secondary | ICD-10-CM | POA: Diagnosis not present

## 2023-08-05 DIAGNOSIS — R634 Abnormal weight loss: Secondary | ICD-10-CM | POA: Diagnosis not present

## 2023-08-05 LAB — CBC: RBC: 4.42 (ref 3.87–5.11)

## 2023-08-05 LAB — CBC AND DIFFERENTIAL
HCT: 42 (ref 36–46)
Hemoglobin: 14.3 (ref 12.0–16.0)
Platelets: 277 K/uL (ref 150–400)
WBC: 6.9

## 2023-08-07 DIAGNOSIS — R1030 Lower abdominal pain, unspecified: Secondary | ICD-10-CM | POA: Diagnosis not present

## 2023-08-12 DIAGNOSIS — R1030 Lower abdominal pain, unspecified: Secondary | ICD-10-CM | POA: Diagnosis not present

## 2023-09-08 DIAGNOSIS — E039 Hypothyroidism, unspecified: Secondary | ICD-10-CM | POA: Diagnosis not present

## 2023-09-08 DIAGNOSIS — E7800 Pure hypercholesterolemia, unspecified: Secondary | ICD-10-CM | POA: Diagnosis not present

## 2023-09-08 DIAGNOSIS — I10 Essential (primary) hypertension: Secondary | ICD-10-CM | POA: Diagnosis not present

## 2023-09-08 DIAGNOSIS — R7303 Prediabetes: Secondary | ICD-10-CM | POA: Diagnosis not present

## 2023-09-08 LAB — COMPREHENSIVE METABOLIC PANEL WITH GFR
Albumin: 4.5 (ref 3.5–5.0)
Calcium: 9.6 (ref 8.7–10.7)
eGFR: 75

## 2023-09-08 LAB — BASIC METABOLIC PANEL WITH GFR
BUN: 19 (ref 4–21)
CO2: 30 — AB (ref 13–22)
Chloride: 96 — AB (ref 99–108)
Creatinine: 0.8 (ref 0.5–1.1)
Glucose: 93
Potassium: 4.2 meq/L (ref 3.5–5.1)
Sodium: 135 — AB (ref 137–147)

## 2023-09-08 LAB — HEPATIC FUNCTION PANEL
ALT: 21 U/L (ref 7–35)
AST: 22 (ref 13–35)
Alkaline Phosphatase: 48 (ref 25–125)
Bilirubin, Total: 0.4

## 2023-09-08 LAB — LIPID PANEL
Cholesterol: 253 — AB (ref 0–200)
HDL: 64 (ref 35–70)
LDL Cholesterol: 157
Triglycerides: 180 — AB (ref 40–160)

## 2023-09-08 LAB — HEMOGLOBIN A1C: Hemoglobin A1C: 6.1

## 2023-09-10 DIAGNOSIS — R7303 Prediabetes: Secondary | ICD-10-CM | POA: Diagnosis not present

## 2023-11-04 DIAGNOSIS — E039 Hypothyroidism, unspecified: Secondary | ICD-10-CM | POA: Diagnosis not present

## 2023-11-04 LAB — TSH
A1c: 6.1
Albumin, Urine POC: 4.05
Creatinine, POC: 187 mg/dL
EGFR: 75
Microalb Creat Ratio: 21.6
TSH: 1.01 (ref 0.41–5.90)

## 2023-11-25 DIAGNOSIS — L710 Perioral dermatitis: Secondary | ICD-10-CM | POA: Diagnosis not present

## 2024-01-12 DIAGNOSIS — Z1231 Encounter for screening mammogram for malignant neoplasm of breast: Secondary | ICD-10-CM | POA: Diagnosis not present

## 2024-01-12 LAB — HM MAMMOGRAPHY

## 2024-02-11 ENCOUNTER — Ambulatory Visit: Payer: Self-pay

## 2024-02-11 NOTE — Telephone Encounter (Signed)
 Copied from CRM [id number]. Topic: Appointments - Appointment Scheduling >> Feb 11, 2024  3:43 PM Rosaria Common wrote: Pt looking to schedule appt, but is in severe abdominal pain. Pt's husband is on the line.  Chief Complaint: abdominal pain Symptoms: chronic intermittent lower abdominal pain Frequency: continual, intermittent Pertinent Negatives: Patient husband denies swelling, nausea, vomiting, diarrhea, urination pain, blood in stool/urine, yellowing of eyes Disposition: [] 911 / [] ED /[] Urgent Care (no appt availability in office) / [x] Appointment(In office/virtual)/ []  [facility]/ [] Home Care/ [] Refused Recommended Disposition /[] [facility]/ []  Follow-up with PCP Additional Notes: Pt husband reporting that pt been experiencing chronic intermittent pain across the lower abdomen, been progressively worsening, especially over last 2 weeks, pain is 5/10 today, at worst 6/10, pt currently laying down trying to rest. Pt husband reporting pt has only seen PCP with [facility], has had testing completed but no diagnosis at this time. Pt husband confirms that pt is walking well, has no other symptoms, but no intervention consistently modifies her pain. Advised pt be examined shortly though new pt, advised go to ED if severe pain or new symptoms, call if questions or worsening. Scheduled new pt appt tomorrow 5/1 with [facility] since first available, plans to establish within [facility], confirmed appt/location info. Pt husband verbalized understanding and appreciation.  Reason for Disposition  Abdominal pain is a chronic symptom (recurrent or ongoing AND present > 4 weeks)  Answer Assessment - Initial Assessment Questions 1. LOCATION: "Where does it hurt?"      Across whole lower abdomen 2. RADIATION: "Does the pain shoot anywhere else?" (e.g., chest, back)     no 3. ONSET: "When did the pain begin?" (e.g., minutes, hours or days ago)      On and off for a year, 6 months ago  intensified now chronic, been this bad since April 1st 4. SUDDEN: "Gradual or sudden onset?"     Gradual, off and on for over a year, started modest then started escalating in last 6-7 weeks, decidedly uncomfortable after every meal, tried diet changes 5. PATTERN "Does the pain come and go, or is it constant?"    - If it comes and goes: "How long does it last?" "Do you have pain now?"     (Note: Comes and goes means the pain is intermittent. It goes away completely between bouts.)    - If constant: "Is it getting better, staying the same, or getting worse?"      (Note: Constant means the pain never goes away completely; most serious pain is constant and gets worse.)      Comes and goes, walking helps her feel little better 6. SEVERITY: "How bad is the pain?"  (e.g., Scale 1-10; mild, moderate, or severe)    - MILD (1-3): Doesn't interfere with normal activities, abdomen soft and not tender to touch.     - MODERATE (4-7): Interferes with normal activities or awakens from sleep, abdomen tender to touch.     - SEVERE (8-10): Excruciating pain, doubled over, unable to do any normal activities.       6/10 at worst, most time, this afternoon is 5/10 7. RECURRENT SYMPTOM: "Have you ever had this type of stomach pain before?" If Yes, ask: "When was the last time?" and "What happened that time?"      Yes, like her PCP with [facility] 9. RELIEVING/AGGRAVATING FACTORS: "What makes it better or worse?" (e.g., antacids, bending or twisting motion, bowel movement)     Walking  sometimes helps, try NSAIDs, heat/ice, position changes, diet changes, nothing modifies it consistently 10. OTHER SYMPTOMS: "Do you have any other symptoms?" (e.g., back pain, diarrhea, fever, urination pain, vomiting)       No  Protocols used: Abdominal Pain - Female-A-AH

## 2024-02-11 NOTE — Telephone Encounter (Signed)
 Arlie Lain NP appt tomorrow

## 2024-02-12 ENCOUNTER — Ambulatory Visit (INDEPENDENT_AMBULATORY_CARE_PROVIDER_SITE_OTHER): Admitting: Physician Assistant

## 2024-02-12 VITALS — BP 160/90 | HR 84 | Temp 98.0°F | Resp 16 | Ht 62.0 in | Wt 123.6 lb

## 2024-02-12 DIAGNOSIS — F0390 Unspecified dementia without behavioral disturbance: Secondary | ICD-10-CM | POA: Diagnosis not present

## 2024-02-12 DIAGNOSIS — R8290 Unspecified abnormal findings in urine: Secondary | ICD-10-CM | POA: Diagnosis not present

## 2024-02-12 DIAGNOSIS — R1030 Lower abdominal pain, unspecified: Secondary | ICD-10-CM | POA: Diagnosis not present

## 2024-02-12 DIAGNOSIS — E039 Hypothyroidism, unspecified: Secondary | ICD-10-CM | POA: Diagnosis not present

## 2024-02-12 DIAGNOSIS — I10 Essential (primary) hypertension: Secondary | ICD-10-CM

## 2024-02-12 DIAGNOSIS — E782 Mixed hyperlipidemia: Secondary | ICD-10-CM | POA: Insufficient documentation

## 2024-02-12 LAB — POCT URINALYSIS DIP (MANUAL ENTRY)
Bilirubin, UA: NEGATIVE
Blood, UA: NEGATIVE
Glucose, UA: NEGATIVE mg/dL
Ketones, POC UA: NEGATIVE mg/dL
Nitrite, UA: NEGATIVE
Protein Ur, POC: NEGATIVE mg/dL
Spec Grav, UA: 1.01 (ref 1.010–1.025)
Urobilinogen, UA: 0.2 U/dL
pH, UA: 7 (ref 5.0–8.0)

## 2024-02-12 NOTE — Assessment & Plan Note (Signed)
 Elevated in office today, cont atenolol 50 mg , dyazide, will monitor. Ordered cmp

## 2024-02-12 NOTE — Assessment & Plan Note (Signed)
 Per husband. Unsure severity will obtain records

## 2024-02-12 NOTE — Progress Notes (Signed)
 New patient visit   Patient: Andrea Boyd   DOB: 11-29-1945   78 y.o. Female  MRN: [id number] Visit Date: 02/12/2024  Today's healthcare provider: Trenton Frock, PA-C   Cc. New patient, abdominal pain  Subjective    Andrea Boyd is a 78 y.o. female with a history of htn, hypothyroidism, HLD, and dementia who presents today as a new patient to establish care. She presents today with her husband. He provides most of the history today.  Discussed the use of AI scribe software for clinical note transcription with the patient, who gave verbal consent to proceed.  History of Present Illness   Andrea Boyd is a 78 year old female who presents with persistent lower abdominal pain. She is accompanied by her husband.  She has experienced severe lower abdominal pain for seven weeks, persistent and unresponsive to Tylenol or dietary changes. The pain is located between the pelvic bone and the navel, occurring more frequently in the mornings and evenings. They report this pain has been persistent for over a year, and just worse over the last 6-7 weeks.  There are no associated symptoms such as fever, nausea, vomiting, bloody stool, diarrhea, stomach cramps, urinary symptoms, or vaginal discharge. Bowel movements are regular, occurring once daily without difficulty or constipation.  She has tried various diets without effect on her symptoms. There have been no recent changes in medication. Her last colonoscopy was approximately seven to eight years ago. Family history includes breast cancer in her mother and colon cancer in her grandmother.       Past Medical History:  Diagnosis Date   Anxiety    Arthritis    Diverticulosis    Hyperlipidemia    Hypertension    IBS (irritable bowel syndrome)    Osteopenia    Thyroid  disease    hypo   Past Surgical History:  Procedure Laterality Date   CHOLECYSTECTOMY  10/14/2002   also removed appendix   Family Status  Relation  Name Status   Mother  Deceased   Father  Deceased   MGM  (Not Specified)   Neg Hx  (Not Specified)  No partnership data on file   Family History  Problem Relation Age of Onset   Breast cancer Mother        in 54s   Diabetes Mother    Heart disease Father    Colon cancer Maternal Grandmother    Allergic rhinitis Neg Hx    Social History   Socioeconomic History   Marital status: Married    Spouse name: Not on file   Number of children: 2   Years of education: Not on file   Highest education level: Some college, no degree  Occupational History   Occupation: retired  Tobacco Use   Smoking status: Never   Smokeless tobacco: Never  Substance and Sexual Activity   Alcohol use: Not Currently    Comment: daily, 1/2 glass   Drug use: No   Sexual activity: Not on file  Other Topics Concern   Not on file  Social History Narrative   Not on file   Social Drivers of Health   Financial Resource Strain: Low Risk  (02/12/2024)   Overall Financial Resource Strain (CARDIA)    Difficulty of Paying Living Expenses: Not hard at all  Food Insecurity: No Food Insecurity (02/12/2024)   Hunger Vital Sign    Worried About Running Out of Food in the Last Year: Never true  Ran Out of Food in the Last Year: Never true  Transportation Needs: No Transportation Needs (02/12/2024)   PRAPARE - Administrator, Civil Service (Medical): No    Lack of Transportation (Non-Medical): No  Physical Activity: Sufficiently Active (02/12/2024)   Exercise Vital Sign    Days of Exercise per Week: 5 days    Minutes of Exercise per Session: 30 min  Stress: Stress Concern Present (02/12/2024)   [employer] of Occupational Health - Occupational Stress Questionnaire    Feeling of Stress : Very much  Social Connections: Moderately Isolated (02/12/2024)   Social Connection and Isolation Panel [NHANES]    Frequency of Communication with Friends and Family: Once a week    Frequency of Social Gatherings  with Friends and Family: Never    Attends Religious Services: 1 to 4 times per year    Active Member of [employer] or Organizations: No    Attends Engineer, structural: Not on file    Marital Status: Married   Outpatient Medications Prior to Visit  Medication Sig   atenolol (TENORMIN) 50 MG tablet Take 1 tablet by mouth Daily.   Calcium Carb-Cholecalciferol (CALCIUM + D3 PO) Take 500 mg by mouth daily.   famotidine  (PEPCID ) 40 MG tablet Take 1 tablet (40 mg total) by mouth daily.   levothyroxine (SYNTHROID, LEVOTHROID) 112 MCG tablet Take 1 tablet by mouth Daily.   MAGNESIUM PO Take by mouth.   Omega-3 Fatty Acids (FISH OIL PO) Take by mouth.   Probiotic Product (PROBIOTIC PO) Take by mouth.   simvastatin (ZOCOR) 40 MG tablet Take 1 tablet by mouth Daily.   triamterene-hydrochlorothiazide (DYAZIDE) 37.5-25 MG per capsule Take 1 tablet by mouth Daily.   TURMERIC PO Take by mouth.   VITAMIN E PO Take by mouth.   [DISCONTINUED] Ascorbic Acid (VITAMIN C) 500 MG CAPS Take by mouth. (Patient not taking: Reported on 05/14/2022)   [DISCONTINUED] BABY ASPIRIN PO Take by mouth. (Patient not taking: Reported on 05/14/2022)   [DISCONTINUED] Cyanocobalamin (VITAMIN B-12 PO) Take by mouth. (Patient not taking: Reported on 05/14/2022)   No facility-administered medications prior to visit.   No Known Allergies  Immunization History  Administered Date(s) Administered   PFIZER(Purple Top)SARS-COV-2 Vaccination 12/16/2019, 01/12/2020    Health Maintenance  Topic Date Due   Hepatitis C Screening  Never done   DTaP/Tdap/Td (1 - Tdap) Never done   Pneumonia Vaccine 47+ Years old (1 of 1 - PCV) Never done   Zoster Vaccines- Shingrix (1 of 2) Never done   DEXA SCAN  Never done   COVID-19 Vaccine (3 - 2024-25 season) 06/15/2023   Medicare Annual Wellness (AWV)  03/10/2024   INFLUENZA VACCINE  05/14/2024   HPV VACCINES  Aged Out   Meningococcal B Vaccine  Aged Out   Colonoscopy  Discontinued     Patient Care Team: Trenton Frock, PA-C as PCP - General (Physician Assistant)  Review of Systems  Constitutional:  Negative for fatigue and fever.  Respiratory:  Negative for cough and shortness of breath.   Cardiovascular:  Negative for chest pain and leg swelling.  Gastrointestinal:  Positive for abdominal pain. Negative for blood in stool, constipation, diarrhea and nausea.  Neurological:  Negative for dizziness and headaches.        Objective    BP (!) 160/90   Pulse 84   Temp 98 F (36.7 C) (Oral)   Resp 16   Ht 5\' 2"  (1.575 m)   Wt 123  lb 9.6 oz (56.1 kg)   SpO2 96%   BMI 22.61 kg/m     Physical Exam Constitutional:      General: She is awake.     Appearance: She is well-developed.  HENT:     Head: Normocephalic.  Eyes:     Conjunctiva/sclera: Conjunctivae normal.  Cardiovascular:     Rate and Rhythm: Normal rate and regular rhythm.     Heart sounds: Normal heart sounds.  Pulmonary:     Effort: Pulmonary effort is normal.     Breath sounds: Normal breath sounds.  Abdominal:     Palpations: Abdomen is soft.     Tenderness: There is no abdominal tenderness. There is no guarding.  Skin:    General: Skin is warm.  Neurological:     Mental Status: She is alert and oriented to person, place, and time.  Psychiatric:        Attention and Perception: Attention normal.        Mood and Affect: Mood normal.        Speech: Speech normal.        Behavior: Behavior is cooperative.    Depression Screen    02/12/2024    1:44 PM  PHQ 2/9 Scores  PHQ - 2 Score 0   Results for orders placed or performed in visit on 02/12/24  POCT urinalysis dipstick  Result Value Ref Range   Color, UA yellow yellow   Clarity, UA clear clear   Glucose, UA negative negative mg/dL   Bilirubin, UA negative negative   Ketones, POC UA negative negative mg/dL   Spec Grav, UA 8.295 6.213 - 1.025   Blood, UA negative negative   pH, UA 7.0 5.0 - 8.0   Protein Ur, POC negative  negative mg/dL   Urobilinogen, UA 0.2 0.2 or 1.0 E.U./dL   Nitrite, UA Negative Negative   Leukocytes, UA Trace (A) Negative    Assessment & Plan     Lower abdominal pain Assessment & Plan: Chronic 1 + year with worsening over the last 6-7 weeks  Labs ordered today, will obtain records of last pcp, colonoscopy. Referring to GI.  Pending labs, would order US  vs CT w/ contrast due to longevity of pain UA w/ trace leuks, unlikely uti but will order culture to confirm  Orders: -     CBC with Differential/Platelet -     Comprehensive metabolic panel with GFR -     Lipase -     Hemoglobin A1c -     TSH -     T4, free -     Ambulatory referral to Gastroenterology -     POCT urinalysis dipstick -     Urine Culture  Hypothyroidism, unspecified type -     TSH -     T4, free  Dementia, unspecified dementia severity, unspecified dementia type, unspecified whether behavioral, psychotic, or mood disturbance or anxiety (HCC) Assessment & Plan: Per husband. Unsure severity will obtain records   Primary hypertension Assessment & Plan: Elevated in office today, cont atenolol 50 mg , dyazide, will monitor. Ordered cmp    Return if symptoms worsen or fail to improve.      Trenton Frock, PA-C  [facility] [phone] (phone) [phone] (fax)  [facility]

## 2024-02-12 NOTE — Assessment & Plan Note (Addendum)
 Chronic 1 + year with worsening over the last 6-7 weeks  Labs ordered today, will obtain records of last pcp, colonoscopy. Referring to GI.  Pending labs, would order US  vs CT w/ contrast due to longevity of pain UA w/ trace leuks, unlikely uti but will order culture to confirm

## 2024-02-13 ENCOUNTER — Encounter: Payer: Self-pay | Admitting: Physician Assistant

## 2024-02-13 LAB — COMPREHENSIVE METABOLIC PANEL WITH GFR
ALT: 16 U/L (ref 0–35)
AST: 21 U/L (ref 0–37)
Albumin: 4.6 g/dL (ref 3.5–5.2)
Alkaline Phosphatase: 42 U/L (ref 39–117)
BUN: 18 mg/dL (ref 6–23)
CO2: 30 meq/L (ref 19–32)
Calcium: 9.3 mg/dL (ref 8.4–10.5)
Chloride: 100 meq/L (ref 96–112)
Creatinine, Ser: 0.7 mg/dL (ref 0.40–1.20)
GFR: 82.06 mL/min (ref >60.00)
Glucose, Bld: 103 mg/dL — ABNORMAL HIGH (ref 70–99)
Potassium: 4.3 meq/L (ref 3.5–5.1)
Sodium: 139 meq/L (ref 135–145)
Total Bilirubin: 0.7 mg/dL (ref 0.2–1.2)
Total Protein: 6.8 g/dL (ref 6.0–8.3)

## 2024-02-13 LAB — CBC WITH DIFFERENTIAL/PLATELET
Basophils Absolute: 0 10*3/uL (ref 0.0–0.1)
Basophils Relative: 0.6 % (ref 0.0–3.0)
Eosinophils Absolute: 0 10*3/uL (ref 0.0–0.7)
Eosinophils Relative: 0.2 % (ref 0.0–5.0)
HCT: 41.3 % (ref 36.0–46.0)
Hemoglobin: 13.7 g/dL (ref 12.0–15.0)
Lymphocytes Relative: 17.1 % (ref 12.0–46.0)
Lymphs Abs: 1 10*3/uL (ref 0.7–4.0)
MCHC: 33.2 g/dL (ref 30.0–36.0)
MCV: 98.1 fl (ref 78.0–100.0)
Monocytes Absolute: 0.5 10*3/uL (ref 0.1–1.0)
Monocytes Relative: 8.2 % (ref 3.0–12.0)
Neutro Abs: 4.3 10*3/uL (ref 1.4–7.7)
Neutrophils Relative %: 73.9 % (ref 43.0–77.0)
Platelets: 241 10*3/uL (ref 150.0–400.0)
RBC: 4.21 Mil/uL (ref 3.87–5.11)
RDW: 13.7 % (ref 11.5–15.5)
WBC: 5.8 10*3/uL (ref 4.0–10.5)

## 2024-02-13 LAB — URINE CULTURE
MICRO NUMBER:: 16400944
Result:: NO GROWTH
SPECIMEN QUALITY:: ADEQUATE

## 2024-02-13 LAB — LIPASE: Lipase: 15 U/L (ref 11.0–59.0)

## 2024-02-13 LAB — TSH: TSH: 0.93 u[IU]/mL (ref 0.35–5.50)

## 2024-02-13 LAB — HEMOGLOBIN A1C: Hgb A1c MFr Bld: 6 % (ref 4.6–6.5)

## 2024-02-13 LAB — T4, FREE: Free T4: 1.12 ng/dL (ref 0.60–1.60)

## 2024-02-17 ENCOUNTER — Ambulatory Visit: Admitting: Nurse Practitioner

## 2024-02-19 ENCOUNTER — Ambulatory Visit: Admitting: Nurse Practitioner

## 2024-03-30 DIAGNOSIS — I10 Essential (primary) hypertension: Secondary | ICD-10-CM | POA: Diagnosis not present

## 2024-03-30 DIAGNOSIS — I7300 Raynaud's syndrome without gangrene: Secondary | ICD-10-CM | POA: Diagnosis not present

## 2024-03-30 DIAGNOSIS — E7800 Pure hypercholesterolemia, unspecified: Secondary | ICD-10-CM | POA: Diagnosis not present

## 2024-03-30 DIAGNOSIS — M8588 Other specified disorders of bone density and structure, other site: Secondary | ICD-10-CM | POA: Diagnosis not present

## 2024-03-30 DIAGNOSIS — R7303 Prediabetes: Secondary | ICD-10-CM | POA: Diagnosis not present

## 2024-03-30 DIAGNOSIS — I700 Atherosclerosis of aorta: Secondary | ICD-10-CM | POA: Diagnosis not present

## 2024-03-30 DIAGNOSIS — L719 Rosacea, unspecified: Secondary | ICD-10-CM | POA: Diagnosis not present

## 2024-03-30 DIAGNOSIS — E039 Hypothyroidism, unspecified: Secondary | ICD-10-CM | POA: Diagnosis not present

## 2024-03-30 DIAGNOSIS — Z0000 Encounter for general adult medical examination without abnormal findings: Secondary | ICD-10-CM | POA: Diagnosis not present

## 2024-04-07 ENCOUNTER — Ambulatory Visit: Admitting: Physician Assistant

## 2024-04-07 ENCOUNTER — Other Ambulatory Visit (INDEPENDENT_AMBULATORY_CARE_PROVIDER_SITE_OTHER)

## 2024-04-07 ENCOUNTER — Ambulatory Visit: Payer: Self-pay | Admitting: Physician Assistant

## 2024-04-07 ENCOUNTER — Encounter: Payer: Self-pay | Admitting: Physician Assistant

## 2024-04-07 VITALS — BP 120/80 | HR 86 | Ht 61.0 in | Wt 118.0 lb

## 2024-04-07 DIAGNOSIS — R630 Anorexia: Secondary | ICD-10-CM

## 2024-04-07 DIAGNOSIS — R1030 Lower abdominal pain, unspecified: Secondary | ICD-10-CM

## 2024-04-07 LAB — BASIC METABOLIC PANEL WITH GFR
BUN: 20 mg/dL (ref 6–23)
CO2: 34 meq/L — ABNORMAL HIGH (ref 19–32)
Calcium: 9.6 mg/dL (ref 8.4–10.5)
Chloride: 96 meq/L (ref 96–112)
Creatinine, Ser: 0.87 mg/dL (ref 0.40–1.20)
GFR: 63.15 mL/min (ref >60.00)
Glucose, Bld: 104 mg/dL — ABNORMAL HIGH (ref 70–99)
Potassium: 3.4 meq/L — ABNORMAL LOW (ref 3.5–5.1)
Sodium: 138 meq/L (ref 135–145)

## 2024-04-07 MED ORDER — TRAMADOL HCL 50 MG PO TABS: 50.0000 mg | ORAL_TABLET | Freq: Four times a day (QID) | ORAL | 2 refills | Status: DC | PRN

## 2024-04-07 NOTE — Progress Notes (Signed)
 Chief Complaint: Abdominal pain  HPI:    Andrea Boyd is a 78 year old female with a past medical history as listed below including IBS and osteopenia as well as dementia, who presents to clinic today with her husband who assists with history and who was referred to me by Cyndi Shaver, PA-C for a complaint of abdominal pain.      08/25/2012 colonoscopy with mild diverticulosis in the ascending and transverse colon, moderate diverticulosis in the descending and sigmoid colon.    12/03/2021 CTAP with contrast with diffuse colonic diverticulosis, diffuse aortic atherosclerosis and no other acute findings.    02/12/2024 CMP, lipase, CBC, hemoglobin A1c, TSH and free T4 all normal.  Urinalysis with trace leukocytes but urine culture was negative.    Today, the patient presents to clinic accompanied by her husband, he tells me he is very observant and has noticed that since mid April she has had an increase in pain that goes from her navel down to her pubic bone.  Around this time he also noted that she is only able to get in about 1000 cal/day and seems to like certain foods over others like ice cream or mashed potatoes, regardless of this so has not lost any weight.  She is having normal bowel movements with no nausea or vomiting.  Rates her pain as an 8/10 constant dull ache.  Sometimes ice packs help but heat makes it worse, walking around seems to help as well.  There is no change with Tylenol or other pain relievers.  Has also noticed that she started to sleep some more, typically 12 hours through the night and sometimes during the day.  He is very worried that something is going on.    Denies fever, chills or blood in her stool.  Past Medical History:  Diagnosis Date   Anxiety    Arthritis    Diverticulosis    Hyperlipidemia    Hypertension    IBS (irritable bowel syndrome)    Osteopenia    Thyroid  disease    hypo    Past Surgical History:  Procedure Laterality Date   CHOLECYSTECTOMY   10/14/2002   also removed appendix    Current Outpatient Medications  Medication Sig Dispense Refill   atenolol (TENORMIN) 50 MG tablet Take 1 tablet by mouth Daily.     Calcium Carb-Cholecalciferol (CALCIUM + D3 PO) Take 500 mg by mouth daily.     famotidine  (PEPCID ) 40 MG tablet Take 1 tablet (40 mg total) by mouth daily. 30 tablet 1   levothyroxine (SYNTHROID, LEVOTHROID) 112 MCG tablet Take 1 tablet by mouth Daily.     MAGNESIUM PO Take by mouth.     Omega-3 Fatty Acids (FISH OIL PO) Take by mouth.     Probiotic Product (PROBIOTIC PO) Take by mouth.     simvastatin (ZOCOR) 40 MG tablet Take 1 tablet by mouth Daily.     triamterene-hydrochlorothiazide (DYAZIDE) 37.5-25 MG per capsule Take 1 tablet by mouth Daily.     TURMERIC PO Take by mouth.     VITAMIN E PO Take by mouth.     No current facility-administered medications for this visit.    Allergies as of 04/07/2024   (No Known Allergies)    Family History  Problem Relation Age of Onset   Breast cancer Mother        in 53s   Diabetes Mother    Heart disease Father    Colon cancer Maternal Grandmother    Allergic  rhinitis Neg Hx     Social History   Socioeconomic History   Marital status: Married    Spouse name: Not on file   Number of children: 2   Years of education: Not on file   Highest education level: Some college, no degree  Occupational History   Occupation: retired  Tobacco Use   Smoking status: Never   Smokeless tobacco: Never  Substance and Sexual Activity   Alcohol use: Not Currently    Comment: daily, 1/2 glass   Drug use: No   Sexual activity: Not on file  Other Topics Concern   Not on file  Social History Narrative   Not on file   Social Drivers of Health   Financial Resource Strain: Low Risk  (02/12/2024)   Overall Financial Resource Strain (CARDIA)    Difficulty of Paying Living Expenses: Not hard at all  Food Insecurity: No Food Insecurity (02/12/2024)   Hunger Vital Sign    Worried  About Running Out of Food in the Last Year: Never true    Ran Out of Food in the Last Year: Never true  Transportation Needs: No Transportation Needs (02/12/2024)   PRAPARE - Administrator, Civil Service (Medical): No    Lack of Transportation (Non-Medical): No  Physical Activity: Sufficiently Active (02/12/2024)   Exercise Vital Sign    Days of Exercise per Week: 5 days    Minutes of Exercise per Session: 30 min  Stress: Stress Concern Present (02/12/2024)   [employer] of Occupational Health - Occupational Stress Questionnaire    Feeling of Stress : Very much  Social Connections: Moderately Isolated (02/12/2024)   Social Connection and Isolation Panel    Frequency of Communication with Friends and Family: Once a week    Frequency of Social Gatherings with Friends and Family: Never    Attends Religious Services: 1 to 4 times per year    Active Member of [employer] or Organizations: No    Attends Engineer, structural: Not on file    Marital Status: Married  Catering manager Violence: Not on file    Review of Systems:    Constitutional: No weight loss, fever or chills Cardiovascular: No chest pain  Respiratory: No SOB  Gastrointestinal: See HPI and otherwise negative   Physical Exam:  Vital signs: BP 120/80   Pulse 86   Ht 5' 1 (1.549 m)   Wt 118 lb (53.5 kg)   BMI 22.30 kg/m    Constitutional:   Pleasant elderly Caucasian female appears to be in NAD, Well developed, Well nourished, alert and cooperative Respiratory: Respirations even and unlabored. Lungs clear to auscultation bilaterally.   No wheezes, crackles, or rhonchi.  Cardiovascular: Normal S1, S2. No MRG. Regular rate and rhythm. No peripheral edema, cyanosis or pallor.  Gastrointestinal:  Soft, nondistended, nontender. No rebound or guarding. Normal bowel sounds. No appreciable masses or hepatomegaly. Rectal:  Not performed.  Psychiatric: Demonstrates good judgement and reason without abnormal  affect or behaviors.  RELEVANT LABS AND IMAGING: CBC    Component Value Date/Time   WBC 5.8 02/12/2024 1426   RBC 4.21 02/12/2024 1426   HGB 13.7 02/12/2024 1426   HCT 41.3 02/12/2024 1426   PLT 241.0 02/12/2024 1426   MCV 98.1 02/12/2024 1426   MCHC 33.2 02/12/2024 1426   RDW 13.7 02/12/2024 1426   LYMPHSABS 1.0 02/12/2024 1426   MONOABS 0.5 02/12/2024 1426   EOSABS 0.0 02/12/2024 1426   BASOSABS 0.0 02/12/2024 1426  CMP     Component Value Date/Time   NA 139 02/12/2024 1426   K 4.3 02/12/2024 1426   CL 100 02/12/2024 1426   CO2 30 02/12/2024 1426   GLUCOSE 103 (H) 02/12/2024 1426   BUN 18 02/12/2024 1426   CREATININE 0.70 02/12/2024 1426   CALCIUM 9.3 02/12/2024 1426   PROT 6.8 02/12/2024 1426   ALBUMIN 4.6 02/12/2024 1426   AST 21 02/12/2024 1426   ALT 16 02/12/2024 1426   ALKPHOS 42 02/12/2024 1426   BILITOT 0.7 02/12/2024 1426    Assessment: 1.  Lower abdominal pain: For the past 3 months, rated as 8/10 constant dull ache, walking helps and so do ice packs; consider musculoskeletal etiology versus other 2.  Change in appetite: Patient only eats about 1000 cal a day and certain food she likes more than others, has not lost any weight; consider relation to dementia versus other  Plan: 1.  Ordered a CT of the abdomen/ pelvis with contrast for further evaluation. 2.  Update BMP 3.  Ordered Tramadol 50 mg every 6 hours #30 as needed for pain 4.  Patient to follow in clinic per recommendations after CT. Assigned to Dr. Avram.  Andrea Failing, PA-C [facility] 04/07/2024, 10:51 AM  Cc: Cyndi Shaver, PA-C

## 2024-04-07 NOTE — Patient Instructions (Signed)
 Your provider has requested that you go to the basement level for lab work before leaving today. Press B on the elevator. The lab is located at the first door on the left as you exit the elevator.  We have sent the following medications to your pharmacy for you to pick up at your convenience: Tramadol 50 mg every 6 hours as needed.   You have been scheduled for a CT scan of the abdomen and pelvis at [facility], 1st floor Radiology. You are scheduled on Wednesday 04/21/24 at 10:30 am. You should arrive 15 minutes prior to your appointment time for registration.    Please follow the written instructions below on the day of your exam:   1) Do not eat anything after 8:30 am (4 hours prior to your test)   You may take any medications as prescribed with a small amount of water, if necessary. If you take any of the following medications: METFORMIN, GLUCOPHAGE, GLUCOVANCE, AVANDAMET, RIOMET, FORTAMET, ACTOPLUS MET, JANUMET, GLUMETZA or METAGLIP, you MAY be asked to HOLD this medication 48 hours AFTER the exam.   The purpose of you drinking the oral contrast is to aid in the visualization of your intestinal tract. The contrast solution may cause some diarrhea. Depending on your individual set of symptoms, you may also receive an intravenous injection of x-ray contrast/dye. Plan on being at [facility] for 45 minutes or longer, depending on the type of exam you are having performed.   If you have any questions regarding your exam or if you need to reschedule, you may call [facility] at [phone] between the hours of 8:00 am and 5:00 pm, Monday-Friday.

## 2024-04-08 ENCOUNTER — Telehealth: Payer: Self-pay | Admitting: Physician Assistant

## 2024-04-08 MED ORDER — TRAMADOL HCL 50 MG PO TABS: 50.0000 mg | ORAL_TABLET | Freq: Four times a day (QID) | ORAL | 2 refills | Status: DC | PRN

## 2024-04-08 NOTE — Addendum Note (Signed)
 Addended by: WILL POWELL CROME on: 04/08/2024 01:33 PM   Modules accepted: Orders

## 2024-04-08 NOTE — Telephone Encounter (Signed)
 Inbound call from patient's husband stating [facility] pharmacy has not received Tramadol medication. Requesting a call back. Please advise, thank you

## 2024-04-09 DIAGNOSIS — M8588 Other specified disorders of bone density and structure, other site: Secondary | ICD-10-CM | POA: Diagnosis not present

## 2024-04-09 DIAGNOSIS — R29890 Loss of height: Secondary | ICD-10-CM | POA: Diagnosis not present

## 2024-04-09 DIAGNOSIS — Z8262 Family history of osteoporosis: Secondary | ICD-10-CM | POA: Diagnosis not present

## 2024-04-09 NOTE — Telephone Encounter (Signed)
 Faxed script for Tramadol  to [facility].

## 2024-04-09 NOTE — Telephone Encounter (Signed)
 Left message informing patient script sent to pharmacy.

## 2024-04-12 ENCOUNTER — Other Ambulatory Visit: Payer: Self-pay

## 2024-04-12 ENCOUNTER — Emergency Department (HOSPITAL_COMMUNITY)
Admission: EM | Admit: 2024-04-12 | Discharge: 2024-04-12 | Disposition: A | Discharge status: Home / Self Care | Source: Ambulatory Visit

## 2024-04-12 ENCOUNTER — Emergency Department (HOSPITAL_COMMUNITY)

## 2024-04-12 DIAGNOSIS — K559 Vascular disorder of intestine, unspecified: Secondary | ICD-10-CM | POA: Diagnosis not present

## 2024-04-12 DIAGNOSIS — R1030 Lower abdominal pain, unspecified: Secondary | ICD-10-CM | POA: Diagnosis present

## 2024-04-12 DIAGNOSIS — K529 Noninfective gastroenteritis and colitis, unspecified: Secondary | ICD-10-CM | POA: Insufficient documentation

## 2024-04-12 DIAGNOSIS — E871 Hypo-osmolality and hyponatremia: Secondary | ICD-10-CM | POA: Diagnosis not present

## 2024-04-12 DIAGNOSIS — I10 Essential (primary) hypertension: Secondary | ICD-10-CM | POA: Diagnosis not present

## 2024-04-12 DIAGNOSIS — Z79899 Other long term (current) drug therapy: Secondary | ICD-10-CM | POA: Insufficient documentation

## 2024-04-12 DIAGNOSIS — Q272 Other congenital malformations of renal artery: Secondary | ICD-10-CM | POA: Diagnosis not present

## 2024-04-12 DIAGNOSIS — E039 Hypothyroidism, unspecified: Secondary | ICD-10-CM | POA: Insufficient documentation

## 2024-04-12 DIAGNOSIS — R1084 Generalized abdominal pain: Secondary | ICD-10-CM | POA: Diagnosis not present

## 2024-04-12 DIAGNOSIS — K5730 Diverticulosis of large intestine without perforation or abscess without bleeding: Secondary | ICD-10-CM | POA: Diagnosis not present

## 2024-04-12 LAB — CBC
HCT: 42.8 % (ref 36.0–46.0)
Hemoglobin: 14.5 g/dL (ref 12.0–15.0)
MCH: 32.7 pg (ref 26.0–34.0)
MCHC: 33.9 g/dL (ref 30.0–36.0)
MCV: 96.4 fL (ref 80.0–100.0)
Platelets: 255 10*3/uL (ref 150–400)
RBC: 4.44 MIL/uL (ref 3.87–5.11)
RDW: 11.9 % (ref 11.5–15.5)
WBC: 9.7 10*3/uL (ref 4.0–10.5)
nRBC: 0 % (ref 0.0–0.2)

## 2024-04-12 LAB — COMPREHENSIVE METABOLIC PANEL WITH GFR
ALT: 21 U/L (ref 0–44)
AST: 28 U/L (ref 15–41)
Albumin: 4.3 g/dL (ref 3.5–5.0)
Alkaline Phosphatase: 41 U/L (ref 38–126)
Anion gap: 10 (ref 5–15)
BUN: 20 mg/dL (ref 8–23)
CO2: 28 mmol/L (ref 22–32)
Calcium: 9.3 mg/dL (ref 8.9–10.3)
Chloride: 96 mmol/L — ABNORMAL LOW (ref 98–111)
Creatinine, Ser: 0.73 mg/dL (ref 0.44–1.00)
GFR, Estimated: >60 mL/min (ref >60)
Glucose, Bld: 160 mg/dL — ABNORMAL HIGH (ref 70–99)
Potassium: 4.1 mmol/L (ref 3.5–5.1)
Sodium: 134 mmol/L — ABNORMAL LOW (ref 135–145)
Total Bilirubin: 1 mg/dL (ref 0.0–1.2)
Total Protein: 7.3 g/dL (ref 6.5–8.1)

## 2024-04-12 LAB — URINALYSIS, ROUTINE W REFLEX MICROSCOPIC
Bacteria, UA: NONE SEEN
Bilirubin Urine: NEGATIVE
Glucose, UA: NEGATIVE mg/dL
Hgb urine dipstick: NEGATIVE
Ketones, ur: NEGATIVE mg/dL
Nitrite: NEGATIVE
Protein, ur: NEGATIVE mg/dL
Specific Gravity, Urine: 1.014 (ref 1.005–1.030)
pH: 6 (ref 5.0–8.0)

## 2024-04-12 LAB — LIPASE, BLOOD: Lipase: 32 U/L (ref 11–51)

## 2024-04-12 LAB — SEDIMENTATION RATE: Sed Rate: 15 mm/h (ref 0–22)

## 2024-04-12 MED ORDER — ALUM & MAG HYDROXIDE-SIMETH 200-200-20 MG/5ML PO SUSP
30.0000 mL | Freq: Once | ORAL | Status: AC
  Administered 2024-04-12: 30 mL via ORAL
  Filled 2024-04-12: qty 30

## 2024-04-12 MED ORDER — DICYCLOMINE HCL 20 MG PO TABS: 20.0000 mg | ORAL_TABLET | Freq: Two times a day (BID) | ORAL | 0 refills | Status: DC

## 2024-04-12 MED ORDER — IOHEXOL 350 MG/ML SOLN
100.0000 mL | Freq: Once | INTRAVENOUS | Status: AC | PRN
  Administered 2024-04-12: 100 mL via INTRAVENOUS

## 2024-04-12 MED ORDER — HYOSCYAMINE SULFATE 0.125 MG SL SUBL
0.2500 mg | SUBLINGUAL_TABLET | Freq: Once | SUBLINGUAL | Status: AC
  Administered 2024-04-12: 0.25 mg via SUBLINGUAL
  Filled 2024-04-12: qty 2

## 2024-04-12 NOTE — Telephone Encounter (Signed)
 Spoke with spouse regarding provider recommendations. He is in agreement and will take patient to [facility] ER this afternoon.

## 2024-04-12 NOTE — Discharge Instructions (Signed)
 Your workup today was reassuring.  Your CT scan did show some enteritis which is some inflammation of your small bowel.  I discussed your case with our GI doctor on-call and they recommend that you follow-up.  Please try the Bentyl to see if this helps with your symptoms.  You should receive a call tomorrow from their office.  If you do not please call tomorrow afternoon.  Return to the ER for worsening symptoms.

## 2024-04-12 NOTE — ED Triage Notes (Signed)
 Pt sent by GI for further eval and CT scan. Reports fatigue, lower abdominal pain, minimal appetite and poor overall PO intake. Denies urinary and bowel changes. Been getting progressively worse since April, significantly worse last few weeks. No fevers

## 2024-04-12 NOTE — Telephone Encounter (Signed)
 Patient is failing outpatient management and can be evaluated in the ED and get CT sooner

## 2024-04-12 NOTE — Telephone Encounter (Signed)
 Inbound call from patient spouse requesting f/u call from nurse to discuss patient plan of care. States patient has been in bed for the last two days now. Please advise.   Thank you

## 2024-04-12 NOTE — Telephone Encounter (Signed)
 Called and spoke with the spouse. He reports that for the past 2 days the patient has not really been eating, she's had a little yogurt and that's all. She is drinking, however very little. She's only drinking about 3 glasses per day. He reports the abdominal pain has not improved. Patient took Tramadol  q6h from 6/26-6/28, but it did not improve the pain. Reports she has been having difficulty sleeping due to abd pain, however she is spending most of her days and 12+ hours at night in bed. Denies n/v, fevers, and diarrhea. He is unsure of what steps to take next and would like provider input.

## 2024-04-12 NOTE — ED Provider Notes (Signed)
 [facility] AT [facility] Provider Note   CSN: [id number] Arrival date & time: 04/12/24  1422     Patient presents with: Abdominal Pain   Andrea Boyd is a 78 y.o. female.   78 year old female with past medical history of hypertension, hyperlipidemia, and hypothyroidism presenting to the emergency department today with abdominal pain.  The patient states that this has been going on now for the past year or so.  Reports that it has worsened and is more severe over the past 2 weeks.  She did follow-up with her GI doctor and had a CT scan ordered.  Her symptoms have worsened since then.  She called her gastroenterologist and was told to come to the ER for further evaluation regarding this today.  Denies any fevers.  Has been having normal bowel movements.  Denies any nausea or vomiting.   Abdominal Pain      Prior to Admission medications   Medication Sig Start Date End Date Taking? Authorizing Provider  dicyclomine (BENTYL) 20 MG tablet Take 1 tablet (20 mg total) by mouth 2 (two) times daily. 04/12/24  Yes Ula Prentice SAUNDERS, MD  atenolol (TENORMIN) 50 MG tablet Take 1 tablet by mouth Daily. 08/01/12   [provider]  Calcium Carb-Cholecalciferol (CALCIUM + D3 PO) Take 500 mg by mouth daily.    [provider]  famotidine  (PEPCID ) 40 MG tablet Take 1 tablet (40 mg total) by mouth daily. 11/29/21   Beather Delon Gibson, PA  levothyroxine (SYNTHROID, LEVOTHROID) 112 MCG tablet Take 1 tablet by mouth Daily. 07/08/12   [provider]  MAGNESIUM PO Take by mouth.    [provider]  Omega-3 Fatty Acids (FISH OIL PO) Take by mouth.    [provider]  Probiotic Product (PROBIOTIC PO) Take by mouth.    [provider]  simvastatin (ZOCOR) 40 MG tablet Take 1 tablet by mouth Daily. 08/01/12   [provider]  traMADol  (ULTRAM ) 50 MG tablet Take 1 tablet (50 mg total) by mouth every 6 (six) hours  as needed. 04/08/24   Beather Delon Gibson, PA  triamterene-hydrochlorothiazide (DYAZIDE) 37.5-25 MG per capsule Take 1 tablet by mouth Daily. 08/01/12   [provider]  TURMERIC PO Take by mouth.    [provider]  VITAMIN E PO Take by mouth.    [provider]    Allergies: Patient has no known allergies.    Review of Systems  Gastrointestinal:  Positive for abdominal pain.  All other systems reviewed and are negative.   Updated Vital Signs BP (!) 177/88 (BP Location: Right Arm)   Pulse 73   Temp 97.9 F (36.6 C) (Oral)   Resp 17   SpO2 99%   Physical Exam Vitals and nursing note reviewed.   Gen: NAD Eyes: PERRL, EOMI HEENT: no oropharyngeal swelling Neck: trachea midline Resp: clear to auscultation bilaterally Card: RRR, no murmurs, rubs, or gallops Abd: tender over the periumbilical region as well as the R and L lower quadrants with no guarding or rebound Extremities: no calf tenderness, no edema Vascular: 2+ radial pulses bilaterally, 2+ DP pulses bilaterally Skin: no rashes Psyc: acting appropriately   (all labs ordered are listed, but only abnormal results are displayed) Labs Reviewed  COMPREHENSIVE METABOLIC PANEL WITH GFR - Abnormal; Notable for the following components:      Result Value   Sodium 134 (*)    Chloride 96 (*)    Glucose, Bld 160 (*)  All other components within normal limits  URINALYSIS, ROUTINE W REFLEX MICROSCOPIC - Abnormal; Notable for the following components:   Leukocytes,Ua SMALL (*)    All other components within normal limits  LIPASE, BLOOD  CBC  SEDIMENTATION RATE  C-REACTIVE PROTEIN    EKG: None  Radiology: CT Angio Abd/Pel W and/or Wo Contrast Result Date: 04/12/2024 CLINICAL DATA:  Mesenteric ischemia, chronic diffuse abdominal pain, fatigue, minimal appetite and poor p.o. intake. EXAM: CTA ABDOMEN AND PELVIS WITHOUT AND WITH CONTRAST TECHNIQUE: Multidetector CT imaging of the abdomen and  pelvis was performed using the standard protocol during bolus administration of intravenous contrast. Multiplanar reconstructed images and MIPs were obtained and reviewed to evaluate the vascular anatomy. RADIATION DOSE REDUCTION: This exam was performed according to the departmental dose-optimization program which includes automated exposure control, adjustment of the mA and/or kV according to patient size and/or use of iterative reconstruction technique. CONTRAST:  OMNIPAQUE  IOHEXOL  350 MG/ML SOLN COMPARISON:  CT abdomen pelvis 12/07/2021 FINDINGS: VASCULAR Aorta: Normal in caliber without dissection. Scattered calcified atherosclerotic plaque. No hemodynamically significant stenosis. Celiac: Patent without evidence of aneurysm, dissection, vasculitis or significant stenosis. SMA: Patent without evidence of aneurysm, dissection, vasculitis or significant stenosis. Renals: Both renal arteries are patent without evidence of aneurysm, dissection, vasculitis, fibromuscular dysplasia or significant stenosis. Accessory right renal artery. IMA: Patent without evidence of aneurysm, dissection, vasculitis or significant stenosis. Inflow: Patent without evidence of aneurysm, dissection, vasculitis or significant stenosis. Proximal Outflow: Bilateral common femoral and visualized portions of the superficial and profunda femoral arteries are patent without evidence of aneurysm, dissection, vasculitis or significant stenosis. Veins: Portal vein and IVC are patent.  Patent renal veins. Review of the MIP images confirms the above findings. NON-VASCULAR Lower chest: No acute abnormality. Hepatobiliary: Cholecystectomy.  No acute abnormality. Pancreas: Unremarkable. Spleen: Unremarkable. Adrenals/Urinary Tract: Normal adrenal glands. No urinary calculi or hydronephrosis. Unremarkable bladder. Stomach/Bowel: Stomach is within normal limits. Fluid-filled small bowel in the left abdomen with possible mild thickening of the wall  and valvulae conniventes. Colonic diverticulosis without diverticulitis. No colonic wall thickening. The appendix is not visualized. No secondary signs of appendicitis. Lymphatic: No lymphadenopathy. Reproductive: Uterus and bilateral adnexa are unremarkable. Other: No free intraperitoneal fluid or air. Musculoskeletal: No acute fracture. IMPRESSION: 1. No evidence of mesenteric ischemia. 2. Fluid-filled small bowel in the left abdomen with possible mild thickening of the wall and valvulae conniventes. Findings are nonspecific but can be seen in the setting of infectious or inflammatory enteritis. 3. Colonic diverticulosis without diverticulitis. Electronically Signed   By: Norman Gatlin M.D.   On: 04/12/2024 21:20     Procedures   Medications Ordered in the ED  alum & mag hydroxide-simeth (MAALOX/MYLANTA) 200-200-20 MG/5ML suspension 30 mL (30 mLs Oral Given 04/12/24 1957)  hyoscyamine (LEVSIN SL) SL tablet 0.25 mg (0.25 mg Sublingual Given 04/12/24 1957)  iohexol  (OMNIPAQUE ) 350 MG/ML injection 100 mL (100 mLs Intravenous Contrast Given 04/12/24 2057)                                    Medical Decision Making 78 year old female with past medical history of hypertension and hyperlipidemia presenting to the emergency department today with lower abdominal pain.  I will further evaluate the patient here with basic labs including LFTs and a lipase to evaluate for hepatobiliary otology or pancreatitis.  Also obtain urinalysis to eval for urinary tract infection.  I will obtain  a CT scan of her abdomen and this point a CT angiogram to evaluate for chronic mesenteric ischemia in addition to appendicitis, diverticulitis, colitis, or other intra-abdominal pathology.  Will give her GI cocktail here as well as Levsin and see if this helps with her symptoms.  Will reevaluate for ultimate disposition.  The patient's labs here are reassuring.  Her case with Dr. San.  Her CT scan showed enteritis.  He  recommends adding on an ESR and CRP and they can follow-up with the patient in clinic.  She does not need to stay for this.  The patient is given a dose of Toradol here.  She will be discharged on Bentyl as the Levsin did not really help.  She is encouraged to follow-up as an outpatient.  Amount and/or Complexity of Data Reviewed Labs: ordered. Radiology: ordered.  Risk OTC drugs. Prescription drug management.        Final diagnoses:  Enteritis    ED Discharge Orders          Ordered    dicyclomine (BENTYL) 20 MG tablet  2 times daily        04/12/24 2151               Ula Prentice SAUNDERS, MD 04/12/24 2152

## 2024-04-13 ENCOUNTER — Telehealth: Payer: Self-pay

## 2024-04-13 NOTE — Telephone Encounter (Signed)
 Left message on machine to call back

## 2024-04-13 NOTE — Telephone Encounter (Signed)
-----   Message from Delon Hendricks Failing sent at 04/13/2024 10:25 AM EDT ----- Regarding: please call Can you guys follow up with this patient and ask how she is doing?  Thanks-JLL ----- Message ----- From: San Sandor GAILS, DO Sent: 04/12/2024   9:44 PM EDT To: Lupita FORBES Commander, MD; Delon Hendricks Failing, [place]  Received a call from the ER on this patient with abdominal pain. ER eval unremarkable. Normal labs and CTA with some non-specific inflammatory changes, but no e/o mesenteric ischemia, obstruction, etc. HD stable and not meeting admission criteria, so ER physician plan is likely to send home tonight. Will send off ESR, CRP. No reported diarrhea to warrant stool studies, etc. If not admitted, can we have someone reach out to her in the AM? Thanks.   VC

## 2024-04-14 NOTE — Telephone Encounter (Signed)
 I have been able to reach the pt and she states she is doing much better and back to normal.  She will call if things change

## 2024-04-21 ENCOUNTER — Ambulatory Visit (HOSPITAL_COMMUNITY)

## 2024-04-26 ENCOUNTER — Other Ambulatory Visit: Payer: Self-pay

## 2024-04-26 MED ORDER — DICYCLOMINE HCL 20 MG PO TABS
20.0000 mg | ORAL_TABLET | Freq: Two times a day (BID) | ORAL | 3 refills | Status: DC | PRN
Start: 2024-04-26 — End: 2024-05-04

## 2024-04-26 NOTE — Telephone Encounter (Signed)
 Returned call to patient's spouse. He states that the patient was prescribed Dicyclomine  20 mg when she was in the ER at [facility]. He reports that the patient has run out of the medication and would like to know if he can have refills ordered. He reports that this medication seems to really help the patient. If ok, please advise on quantity and number of refills.

## 2024-04-26 NOTE — Telephone Encounter (Signed)
 Inbound call fro patient spouse requesting f/u call in regards to patient care. Also requesting medication refill on dicyclomine  to [facility].  Please advise.

## 2024-04-27 ENCOUNTER — Ambulatory Visit (HOSPITAL_COMMUNITY)
Admission: RE | Admit: 2024-04-27 | Discharge: 2024-04-27 | Disposition: A | Discharge status: Home / Self Care | Source: Ambulatory Visit | Attending: Physician Assistant | Admitting: Physician Assistant

## 2024-04-27 DIAGNOSIS — K5730 Diverticulosis of large intestine without perforation or abscess without bleeding: Secondary | ICD-10-CM | POA: Diagnosis not present

## 2024-04-27 DIAGNOSIS — R1030 Lower abdominal pain, unspecified: Secondary | ICD-10-CM | POA: Diagnosis not present

## 2024-04-27 DIAGNOSIS — N281 Cyst of kidney, acquired: Secondary | ICD-10-CM | POA: Diagnosis not present

## 2024-04-27 DIAGNOSIS — K7689 Other specified diseases of liver: Secondary | ICD-10-CM | POA: Diagnosis not present

## 2024-04-27 DIAGNOSIS — R630 Anorexia: Secondary | ICD-10-CM | POA: Insufficient documentation

## 2024-04-27 MED ORDER — IOHEXOL 9 MG/ML PO SOLN
1000.0000 mL | ORAL | Status: AC
  Administered 2024-04-27: 1000 mL via ORAL

## 2024-04-27 MED ORDER — SODIUM CHLORIDE (PF) 0.9 % IJ SOLN
INTRAMUSCULAR | Status: AC
  Filled 2024-04-27: qty 50

## 2024-04-27 MED ORDER — IOHEXOL 300 MG/ML  SOLN
100.0000 mL | Freq: Once | INTRAMUSCULAR | Status: AC | PRN
  Administered 2024-04-27: 100 mL via INTRAVENOUS

## 2024-04-27 MED ORDER — IOHEXOL 9 MG/ML PO SOLN: 500.0000 mL | ORAL | Status: DC

## 2024-05-03 ENCOUNTER — Telehealth: Payer: Self-pay | Admitting: Physician Assistant

## 2024-05-03 NOTE — Telephone Encounter (Signed)
 Attempted to reach patient to discuss medication. No answer, left vm for patient to return call.

## 2024-05-03 NOTE — Telephone Encounter (Signed)
 PT is calling to discuss with a nurse the effects of dicyclomine . She feels it is not helping and she would like to try something different. Please advise.

## 2024-05-03 NOTE — Telephone Encounter (Signed)
 Left message on machine to call back

## 2024-05-03 NOTE — Telephone Encounter (Signed)
 Inbound call from patients husband stating wife recently had a ct scan on 7/15 and they didn't find the issue with what's causing her pain. Also stated the medication Dicyclomine  that was prescribed to her isn't helping  Requesting a call back   Please advise  Thank you

## 2024-05-04 MED ORDER — HYOSCYAMINE SULFATE 0.125 MG SL SUBL: 0.1250 mg | SUBLINGUAL_TABLET | Freq: Four times a day (QID) | SUBLINGUAL | 1 refills | Status: DC | PRN

## 2024-05-04 NOTE — Telephone Encounter (Signed)
 Called patient. She reports that the Dicyclomine  had been helping, but no it doesn't seem to be as effective. Patient states she believes the Tramadol  messed with my stomach and I stopped taking it. She would like to try hyoscyamine .

## 2024-05-04 NOTE — Telephone Encounter (Signed)
 Spoke with patient. She reports she is continuing to have an achy, steady pain to her lower abdomen. She doesn't feel like the Dicyclomine  is working for her any longer. Denies any other symptoms or concerns just the abd pain. She would like to know if there is anything else that can be prescribed to assist with this pain.

## 2024-05-04 NOTE — Telephone Encounter (Signed)
 Orders placed for Hyoscyamine . Patient already aware.

## 2024-05-05 ENCOUNTER — Other Ambulatory Visit (HOSPITAL_COMMUNITY): Payer: Self-pay

## 2024-05-05 ENCOUNTER — Telehealth: Payer: Self-pay

## 2024-05-05 NOTE — Telephone Encounter (Signed)
 Pharmacy Patient Advocate Encounter   Received notification from CoverMyMeds that prior authorization for Hyoscyamine  Sulfate 0.125MG  sublingual tablets is required/requested.   Insurance verification completed.   The patient is insured through [facility] Medicare Part D .   Per test claim: PA required; PA submitted to above mentioned insurance via CoverMyMeds Key/confirmation #/EOC [id number] Status is pending

## 2024-05-06 MED ORDER — HYOSCYAMINE SULFATE 0.125 MG SL SUBL: 0.1250 mg | SUBLINGUAL_TABLET | Freq: Four times a day (QID) | SUBLINGUAL | 1 refills | Status: DC | PRN

## 2024-05-06 NOTE — Telephone Encounter (Signed)
 Pharmacy Patient Advocate Encounter  Received notification from [facility] Medicare Part D that Prior Authorization for Hyoscyamine  Sulfate 0.125MG  sublingual tablets has been DENIED.  Full denial letter will be uploaded to the media tab. See denial reason below.  Your requested medication belongs to a class of drugs called Less Than Effective Drug Efficacy Study Implementation (DESI) Drugs. DESI drugs are excluded from coverage under Medicare rules.   PA #/Case ID/Reference #: [id number]

## 2024-05-06 NOTE — Telephone Encounter (Signed)
 See denial and advise how you would like to proceed.

## 2024-05-06 NOTE — Addendum Note (Signed)
 Addended by: WILL POWELL CROME on: 05/06/2024 11:59 AM   Modules accepted: Orders

## 2024-05-06 NOTE — Telephone Encounter (Signed)
 Spoke with patient informed her with GoodRx Levsin  is $13 at [employer]. Patient agreed to use GoodRx. New script sent to [facility].

## 2024-05-07 DIAGNOSIS — H1131 Conjunctival hemorrhage, right eye: Secondary | ICD-10-CM | POA: Diagnosis not present

## 2024-05-18 ENCOUNTER — Telehealth: Payer: Self-pay | Admitting: Physician Assistant

## 2024-05-18 ENCOUNTER — Encounter: Payer: Self-pay | Admitting: *Deleted

## 2024-05-18 NOTE — Telephone Encounter (Signed)
 Inbound call from patients husband stating wife was seen 04/07/24 and was recently prescribed hyoscyamine  and medication hasn't helped, husband is stating wife hasn't been able to eat and is experiencing abdominal pain.  Requesting a call back from nurse   Please advise  Thank you

## 2024-05-18 NOTE — Telephone Encounter (Signed)
 Called and spoke with patient's spouse. He reports that patient is still having severe abdominal pain constantly. The Hyoscyamine  has not helped her pain. Reports for the last 3 days the patient has not eaten much due to this. Spouse is concerned because no one has been able to find the source of the abdominal pain. Appointment scheduled for 05/28/2024 at 1330 with Camie.

## 2024-05-20 NOTE — Telephone Encounter (Signed)
 Called and spoke with patient's spouse. He reports that patient's bms are WNL. No constipation or diarrhea issues. Discussed the possibility of scheduling with patient's PCP for possible musculoskeletal pain. Spouse became upset at this request, stating the primary has been part of the problem. Everything else has been ruled out, she needs a colonoscopy to see what's going on. Also discussed taking patient back to ER, spouse     While on the phone, spouse asked about any sooner available appointments. Was able to get patient scheduled for 05/27/2024 at 1050 with Dr. Avram.

## 2024-05-27 ENCOUNTER — Encounter: Payer: Self-pay | Admitting: Internal Medicine

## 2024-05-27 ENCOUNTER — Other Ambulatory Visit (INDEPENDENT_AMBULATORY_CARE_PROVIDER_SITE_OTHER)

## 2024-05-27 ENCOUNTER — Ambulatory Visit: Admitting: Internal Medicine

## 2024-05-27 VITALS — BP 110/80 | HR 66 | Ht 63.0 in | Wt 122.4 lb

## 2024-05-27 DIAGNOSIS — M545 Low back pain, unspecified: Secondary | ICD-10-CM

## 2024-05-27 DIAGNOSIS — R109 Unspecified abdominal pain: Secondary | ICD-10-CM

## 2024-05-27 DIAGNOSIS — G8929 Other chronic pain: Secondary | ICD-10-CM

## 2024-05-27 DIAGNOSIS — M4180 Other forms of scoliosis, site unspecified: Secondary | ICD-10-CM | POA: Diagnosis not present

## 2024-05-27 LAB — VITAMIN B12: Vitamin B-12: 1023 pg/mL — ABNORMAL HIGH (ref 211–911)

## 2024-05-27 NOTE — Progress Notes (Signed)
 Andrea Boyd 78 y.o. 08-Dec-1945 [id number]  Assessment & Plan:   Encounter Diagnoses  Name Primary?   Chronic abdominal pain Yes   Chronic low back pain, unspecified back pain laterality, unspecified whether sciatica present    Dextroscoliosis         Chronic abdominal pain, constant, Previous imaging negative for gastrointestinal causes and no associated bowel habit changes, bleeding to suggest need for colonoscopy. Constant ache helped by cool pack - sounds like it could be neuropathic. She has had it since at least 2023. Neuromodulator Tx like TCA (amitriptyline or nortriptyline) is a consideration but w/ age and dementia will avoid. 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 Previous imaging showed degenerative changes. - Refer to orthopedics for evaluation of back pain. Perhaps treatment of that would help abdominal ache - ? PT referral  Short-term memory impairment and dementia ? How this impacts her experience and reaction to symptoms     Lab Results  Component Value Date   VITAMINB12 1,023 (H) 05/27/2024    High level is associated with recent ingestion - she is not defiicient      Subjective:   Chief Complaint: abdominal pain  HPI Discussed the use of AI scribe software for clinical note transcription with the patient, who gave verbal consent to proceed.   Andrea Boyd is an 78 year old female who presents with severe chronic abdominal pain. Husband is present and participates.  She experiences severe chronic abdominal pain that has persisted for years, described as a constant ache from the navel to the pubic bone. The pain is constant, sometimes disrupting sleep, and has exacerbated over the past two months, becoming more  frequent and severe. She has intolerance of some foods, particularly roughage, while starches and dairy are tolerated. No bowel irregularities, nausea, vomiting, or weight loss, although her weight is maintained with ice cream consumption. Occasional gas is present, but no significant belching. Previous diagnostic evaluations, including CT scans and a colonoscopy in 2013, revealed diverticulosis but no other significant findings. Prior treatments with antispasmodic medications and tramadol  have not provided relief.  She also experiences chronic back pain, attributed to arthritis, located in the lower back and extending upwards. No numbness, tingling, or weakness in the legs. A history of scoliosis is noted, with a spinal injection approximately seven years ago providing temporary relief. Cold packs offer some relief from back pain.  She takes a B12 supplement daily, although her B12 levels have not been checked recently. Her son assists with communication due to her memory impairment, described as short-term memory issues. Despite this, she is able to perform her activities of daily living independently. Goes to gym daily.    Has been seen in our clinic by Delon Failing PA-C 11/2021, 05/2022, 04/07/24 Antispasmodics and acid suppression unhelpful Same type of pain 8/10 ache - apparently now more frequent    Wt Readings from Last 3 Encounters:  05/27/24 122 lb 6 oz (55.5 kg)  04/07/24 118 lb (53.5 kg)  02/12/24 123 lb 9.6 oz (56.1 kg)  05/14/22 117#  CT abdomen pelvis with contrast 04/28/2024 IMPRESSION: Colonic diverticulosis, without radiographic evidence of diverticulitis or other acute findings. CT angio abdomen and pelvis with and without contrast April 12, 2024 IMPRESSION: 1. No evidence of mesenteric ischemia. 2.  Fluid-filled small bowel in the left abdomen with possible mild thickening of the wall and valvulae conniventes. Findings are nonspecific but can be seen in the setting of  infectious or inflammatory enteritis. 3. Colonic diverticulosis without diverticulitis.  12/23/22 DG Lumbar spine  Extensive degenerative changes and dextroconves scoliosis lumbar spine Allergies  Allergen Reactions   Gabapentin Other (See Comments)    Stomach pain    Current Meds  Medication Sig   AMBULATORY NON FORMULARY MEDICATION Take 1 tablet by mouth every morning. Medication Name: Blink NutriTears Supplement for Dry Eyes   atenolol (TENORMIN) 50 MG tablet Take 1 tablet by mouth Daily.   Calcium Carb-Cholecalciferol (CALCIUM + D3 PO) Take 500 mg by mouth daily.   levothyroxine (SYNTHROID, LEVOTHROID) 112 MCG tablet Take 1 tablet by mouth Daily.   MAGNESIUM PO Take 2 tablets by mouth daily. Magnesium Taurate   Omega-3 Fatty Acids (FISH OIL PO) Take by mouth.   simvastatin (ZOCOR) 40 MG tablet Take 1 tablet by mouth Daily.   triamterene-hydrochlorothiazide (DYAZIDE) 37.5-25 MG per capsule Take 1 tablet by mouth Daily.   TURMERIC PO Take by mouth.   VITAMIN E PO Take by mouth.   Past Medical History:  Diagnosis Date   Anxiety    Arthritis    Diverticulosis    Hyperlipidemia    Hypertension    IBS (irritable bowel syndrome)    Osteopenia    Thyroid  disease    hypo   Past Surgical History:  Procedure Laterality Date   CHOLECYSTECTOMY  10/14/2002   also removed appendix   Social History   Social History Narrative   Not on file   family history includes Breast cancer in her mother; Colon cancer in her maternal grandmother; Diabetes in her mother; Heart disease in her father.   Review of Systems   Objective:   Physical Exam @BP  110/80   Pulse 66   Ht 5' 3 (1.6 m)   Wt 122 lb 6 oz (55.5 kg)   BMI 21.68 kg/m @  General:  NAD Eyes:   anicteric Lungs:  clear Heart::  S1S2 no rubs, murmurs or gallops Abdomen:  soft and nontender, BS+ - negative Carnett's sign - able to do straight leg raise easily  She is tender in R groin/pelvic bones and pubic  symphysis  Neuro: Good and symmetric strength of hip flexors, feet (able to squat and rise, can stand on toes and heels)  She is alert and appropriate, she did not know what year it is, knew former PCP but not new one  Spine - non-tender - dextroscoliosis lower thoracis and lumbar spine  Ext:   no edema, cyanosis or clubbing    Data Reviewed:  See HPI  I spent >40 minutes of time, including in depth chart review, independent review of results as outlined above, communicating results with the patient directly, face-to-face time with the patient, coordinating care, ordering studies and medications as appropriate, and documentation.

## 2024-05-27 NOTE — Patient Instructions (Addendum)
 Your provider has requested that you go to the basement level for lab work before leaving today. Press B on the elevator. The lab is located at the first door on the left as you exit the elevator.  Due to recent changes in healthcare laws, you may see the results of your imaging and laboratory studies on MyChart before your provider has had a chance to review them.  We understand that in some cases there may be results that are confusing or concerning to you. Not all laboratory results come back in the same time frame and the provider may be waiting for multiple results in order to interpret others.  Please give us  48 hours in order for your provider to thoroughly review all the results before contacting the office for clarification of your results.   We have place a referral to orthopedics and they will contact you about an appointment for your back pain.   I appreciate the opportunity to care for you. Lupita Commander, MD, [facility]

## 2024-05-28 ENCOUNTER — Ambulatory Visit: Admitting: Gastroenterology

## 2024-05-28 ENCOUNTER — Ambulatory Visit: Payer: Self-pay | Admitting: Internal Medicine

## 2024-07-05 ENCOUNTER — Ambulatory Visit: Admitting: Orthopedic Surgery

## 2024-08-04 ENCOUNTER — Other Ambulatory Visit (INDEPENDENT_AMBULATORY_CARE_PROVIDER_SITE_OTHER)

## 2024-08-04 ENCOUNTER — Ambulatory Visit (INDEPENDENT_AMBULATORY_CARE_PROVIDER_SITE_OTHER): Admitting: Orthopedic Surgery

## 2024-08-04 VITALS — BP 200/77 | HR 59 | Ht 63.0 in | Wt 123.0 lb

## 2024-08-04 DIAGNOSIS — M545 Low back pain, unspecified: Secondary | ICD-10-CM | POA: Diagnosis not present

## 2024-08-04 NOTE — Progress Notes (Signed)
 Orthopedic Spine Surgery Office Note  Assessment: Patient is a 78 y.o. female with lumbar degenerative scoliosis   Plan: - Patient has a significant lumbar degenerative curvature, I discussed the options with her.  I told her there were 3 main categories for this issue: physical therapy, medications, surgical.  I covered the risks associated with deformity correction surgery which included 30% rate of major complication.  After this discussion, patient did not want to proceed with surgery.  She was more interested in medication management.  Referral was provided to her for pain management -Patient should return to office on an as-needed basis   Patient expressed understanding of the plan and all questions were answered to the patient's satisfaction.   ___________________________________________________________________________   History:  Patient is a 78 y.o. female who presents today for lumbar spine.  Patient has had upper lumbar and lower thoracic back pain for about a year and it has gotten worse over time.  She does not have pain radiating to either lower extremity.  There was no trauma or injury that preceded the onset of the pain.   Weakness: Denies Symptoms of imbalance: Denies Paresthesias and numbness: Denies Bowel or bladder incontinence: Denies Saddle anesthesia: Denies  Treatments tried: PT, Tylenol, NSAIDs  Review of systems: Denies fevers and chills, night sweats, unexplained weight loss, history of cancer.  Has had pain that wakes her at night  Past medical history: HLD HTN Chronic pain Osteopenia IBS  Allergies: gabapentin  Past surgical history:  Cholecystectomy  Social history: Denies use of nicotine product (smoking, vaping, patches, smokeless) Alcohol use: Yes, approximately 2-3 drinks per week Denies recreational drug use   Physical Exam:  BMI of 21.8  General: no acute distress, appears stated age Neurologic: alert, answering questions  appropriately, following commands Respiratory: unlabored breathing on room air, symmetric chest rise Psychiatric: appropriate affect, normal cadence to speech   MSK (spine):  -Strength exam      Left  Right EHL    5/5  5/5 TA    5/5  5/5 GSC    5/5  5/5 Knee extension  5/5  5/5 Hip flexion   5/5  5/5  -Sensory exam    Sensation intact to light touch in L3-S1 nerve distributions of bilateral lower extremities  -Achilles DTR: 1/4 on the left, 1/4 on the right -Patellar tendon DTR: 1/4 on the left, 1/4 on the right  -Straight leg raise: negative bilaterally -Clonus: no beats bilaterally  -Left hip exam: no pain through range of motion -Right hip exam: no pain through range of motion  Imaging: XRs of the lumbar spine from 06/04/2024 were independently reviewed and interpreted, showing a lumbar degenerative scoliosis with apex to the right at L2/3.  Angle of 53 degrees.  Disc height loss at multiple levels.  No fracture or dislocation seen.  No evidence of instability on flexion/extension views.  CT of the abdomen pelvis from 04/27/2024 did not show any lesions within the vertebra   Patient name: Andrea Boyd Patient MRN: [id number] Date of visit: 08/04/24

## 2024-08-16 ENCOUNTER — Encounter: Payer: Self-pay | Admitting: Radiology

## 2024-08-24 ENCOUNTER — Encounter: Payer: Self-pay | Admitting: Physical Medicine & Rehabilitation

## 2024-10-04 ENCOUNTER — Encounter: Payer: Self-pay | Admitting: Physician Assistant

## 2024-10-05 ENCOUNTER — Encounter: Payer: Self-pay | Admitting: Physical Medicine & Rehabilitation

## 2024-10-05 ENCOUNTER — Encounter: Admitting: Physical Medicine & Rehabilitation

## 2024-10-05 VITALS — BP 193/100 | HR 52 | Ht 63.0 in | Wt 127.0 lb

## 2024-10-05 DIAGNOSIS — M4126 Other idiopathic scoliosis, lumbar region: Secondary | ICD-10-CM | POA: Insufficient documentation

## 2024-10-05 DIAGNOSIS — M47816 Spondylosis without myelopathy or radiculopathy, lumbar region: Secondary | ICD-10-CM | POA: Insufficient documentation

## 2024-10-05 NOTE — Progress Notes (Signed)
 "  Subjective:    Patient ID: Andrea Boyd, female    DOB: 16-Apr-1946, 78 y.o.   MRN: [id number]  HPI  Discussed the use of AI scribe software for clinical note transcription with the patient, who gave verbal consent to proceed.  History of Present Illness Andrea Boyd is an 78 year old female with severe scoliosis, lumbar spondylosis, and dementia who presents for evaluation and management of worsening chronic back pain.  For the past year, she has experienced progressively worsening back pain, most severe with sitting and improved with walking. The pain is primarily localized to the right paraspinal region, occasionally radiating up the mid-back, and is described as a 'gentle roll out' near the spine with intermittent swelling below the scapulae. She denies sensation of rib-pelvis contact.  She intermittently experiences shooting pain down the right leg, described as episodic without clear precipitating factors. She denies current numbness or weakness. She remains independent with activities of daily living and has not experienced any falls.  She attends the gym three to four times per week, engaging in moderate aerobic exercise, dancing, stretching, and light weights, which provide some symptomatic relief. A prior six-week course of physical therapy focused on stretching yielded minimal long-term benefit. Topical analgesics, including menthol-based products, have not provided relief.  She also reports intermittent abdominal pain, described as burning or dull and centered below the umbilicus, which alternates in character and limits her dietary intake to starches and creamy foods. Prior gastrointestinal and bladder evaluations were unrevealing.  Cognitive impairment is present, and she is noted to have difficulty self-reporting pain, often minimizing her discomfort. She is currently treated with Aricept for cognitive symptoms. She is not taking anticoagulants. She occasionally drives,  but her husband is typically the primary driver.   Pain Inventory Average Pain 8 Pain Right Now 8 My pain is constant and aching  In the last 24 hours, has pain interfered with the following? General activity 7 Relation with others 7 Enjoyment of life 9 What TIME of day is your pain at its worst? daytime and night Sleep (in general) Good  Pain is worse with: sitting Pain improves with: pacing activities Relief from Meds: none taken  walk without assistance how many minutes can you walk? 20 ability to climb steps?  yes do you drive?  no Do you have any goals in this area?  yes  retired I need assistance with the following:  meal prep, household duties, and shopping Do you have any goals in this area?  yes  bowel control problems confusion anxiety  Any changes since last visit?  no  New patient    Family History  Problem Relation Age of Onset   Breast cancer Mother        in 65s   Diabetes Mother    Heart disease Father    Colon cancer Maternal Grandmother    Allergic rhinitis Neg Hx    Social History   Socioeconomic History   Marital status: Married    Spouse name: Not on file   Number of children: 2   Years of education: Not on file   Highest education level: Some college, no degree  Occupational History   Occupation: retired  Tobacco Use   Smoking status: Never   Smokeless tobacco: Never  Vaping Use   Vaping status: Never Used  Substance and Sexual Activity   Alcohol use: Yes    Comment: daily, 1/2 glass   Drug use: No   Sexual activity: Not on  file  Other Topics Concern   Not on file  Social History Narrative   Not on file   Social Drivers of Health   Tobacco Use: Low Risk (05/27/2024)   Patient History    Smoking Tobacco Use: Never    Smokeless Tobacco Use: Never    Passive Exposure: Not on file  Financial Resource Strain: Low Risk (02/12/2024)   Overall Financial Resource Strain (CARDIA)    Difficulty of Paying Living Expenses: Not  hard at all  Food Insecurity: No Food Insecurity (02/12/2024)   Hunger Vital Sign    Worried About Running Out of Food in the Last Year: Never true    Ran Out of Food in the Last Year: Never true  Transportation Needs: No Transportation Needs (02/12/2024)   PRAPARE - Administrator, Civil Service (Medical): No    Lack of Transportation (Non-Medical): No  Physical Activity: Sufficiently Active (02/12/2024)   Exercise Vital Sign    Days of Exercise per Week: 5 days    Minutes of Exercise per Session: 30 min  Stress: Stress Concern Present (02/12/2024)   [employer] of Occupational Health - Occupational Stress Questionnaire    Feeling of Stress : Very much  Social Connections: Moderately Isolated (02/12/2024)   Social Connection and Isolation Panel    Frequency of Communication with Friends and Family: Once a week    Frequency of Social Gatherings with Friends and Family: Never    Attends Religious Services: 1 to 4 times per year    Active Member of Clubs or Organizations: No    Attends Engineer, Structural: Not on file    Marital Status: Married  Depression (PHQ2-9): Low Risk (02/12/2024)   Depression (PHQ2-9)    PHQ-2 Score: 0  Alcohol Screen: Low Risk (02/12/2024)   Alcohol Screen    Last Alcohol Screening Score (AUDIT): 3  Housing: Low Risk (02/12/2024)   Housing Stability Vital Sign    Unable to Pay for Housing in the Last Year: No    Number of Times Moved in the Last Year: 0    Homeless in the Last Year: No  Utilities: Not on file  Health Literacy: Not on file   Past Surgical History:  Procedure Laterality Date   CHOLECYSTECTOMY  10/14/2002   also removed appendix   Past Medical History:  Diagnosis Date   Anxiety    Arthritis    Diverticulosis    Hyperlipidemia    Hypertension    IBS (irritable bowel syndrome)    Osteopenia    Thyroid  disease    hypo   There were no vitals taken for this visit.  Opioid Risk Score:   Fall Risk Score:   `1  Depression screen [facility] 2/9     02/12/2024    1:44 PM  Depression screen PHQ 2/9  Decreased Interest 0  Down, Depressed, Hopeless 0  PHQ - 2 Score 0    Review of Systems  Gastrointestinal:  Positive for abdominal pain.  Musculoskeletal:  Positive for back pain.  All other systems reviewed and are negative.      Objective:   Physical Exam  General no acute distress Mood and affect flat no agitation. Motor strength is 5/5 bilateral deltoid bicep tricep grip, 5/5 bilateral hip flexors knee extensors ankle dorsiflexors and plantar flexors Negative straight leg raising bilaterally Dextroscoliosis lumbar apex at L3 there is also a rotatory component above L5 Tenderness palpation lumbar paraspinals increases with extension patient has a forward flexed  posture and has very limited extension she gets to neutral but not beyond She ambulates with a forward flexed posture no evidence of toe drag or knee instability. She has tenderness right over the iliac crest near the ASIS on the right side with ribs just adjacent to the iliac crest     Assessment & Plan:  Assessment and Plan Assessment & Plan Chronic back pain due to scoliosis and lumbar spondylosis Chronic, severe back pain from advanced scoliosis and lumbar spondylosis, worsened by sitting, with imaging showing marked scoliosis and severe spondylosis. Minimal relief from prior therapies. Intermittent right leg pain without radicular symptoms. Cognitive decline limits systemic analgesic use. Procedural intervention needed to identify pain source and reduce medication risks. Radiofrequency ablation may offer 6-12 months relief with low risk. - Scheduled diagnostic bilateral L3, L4, L5 medial branch nerve blocks under fluoroscopic guidance to assess facet-mediated pain. - Repeat procedure if diagnostic blocks provide =80% temporary pain relief to confirm findings. - Proceed with radiofrequency ablation of medial branch nerves if two sets  of diagnostic blocks are successful for longer-term analgesia. - Discussed buprenorphine patch as alternative if procedural interventions are ineffective, considering age and cognitive status. - Reviewed acupuncture as alternative; she prefers to avoid. - Instructed staff to schedule procedure and complete pre-procedure checklist. - Advised procedure is low risk, does not require cessation of current medications; recommended husband provide transportation. - Planned to monitor blood pressure prior to procedure to ensure safe limits.   "

## 2024-10-05 NOTE — Patient Instructions (Signed)
" °  VISIT SUMMARY: Today, we evaluated and managed your worsening chronic back pain due to scoliosis and lumbar spondylosis. We discussed various treatment options and planned a procedural intervention to help identify the source of your pain and provide relief.  YOUR PLAN: CHRONIC BACK PAIN DUE TO SCOLIOSIS AND LUMBAR SPONDYLOSIS: You have severe back pain from advanced scoliosis and lumbar spondylosis, which worsens when you sit. Previous treatments have provided minimal relief. -We have scheduled diagnostic bilateral L3, L4, L5 medial branch nerve blocks under fluoroscopic guidance to assess if your pain is facet-mediated. -If the diagnostic blocks provide at least 80% temporary pain relief, we will repeat the procedure to confirm the findings. -If two sets of diagnostic blocks are successful, we will proceed with radiofrequency ablation of the medial branch nerves for longer-term pain relief. -If procedural interventions are ineffective, we may consider a buprenorphine patch as an alternative, taking into account your age and cognitive status. -We discussed acupuncture as an alternative, but you prefer to avoid it. -Our staff will schedule the procedure and complete the pre-procedure checklist. -The procedure is low risk and does not require you to stop your current medications. Your husband should provide transportation. -We will monitor your blood pressure before the procedure to ensure it is within safe limits.                      Contains text generated by [employer]bridge.                                 Contains text generated by [employer]bridge.   "

## 2024-11-18 NOTE — Progress Notes (Unsigned)
" °  PROCEDURE RECORD [facility]   Name: YAN OKRAY DOB:28-Jul-1946 MRN: [id number]  Date:11/18/2024  Physician: Prentice Compton, MD    Nurse/CMA: Onisha Cedeno RN  Allergies: Allergies[1]  Consent Signed: {yes [id number]}  Is patient diabetic? {yes no:314532}  CBG today? ***  Pregnant: {yes no:314532} LMP: No LMP recorded. Patient is postmenopausal. (age 79-55)  Anticoagulants: {Yes/No:19989} Anti-inflammatory: {Yes/No:19989} Antibiotics: {Yes/No:19989}  Procedure: bilateral lumbar 3-4-5 medical branch blocks Position: Prone Start Time: ***  End Time: ***  Fluoro Time: ***  RN/CMA Designer, Multimedia    Time *** ***    BP *** ***    Pulse *** ***    Respirations *** ***    O2 Sat *** ***    S/S *** ***    Pain Level *** ***     D/C home with ***, patient A & O X 3, D/C instructions reviewed, and sits independently.           [1]  Allergies Allergen Reactions   Gabapentin Other (See Comments)    Stomach pain    "

## 2024-11-19 ENCOUNTER — Encounter: Attending: Physical Medicine & Rehabilitation | Admitting: Physical Medicine & Rehabilitation

## 2024-11-19 ENCOUNTER — Encounter: Payer: Self-pay | Admitting: Physical Medicine & Rehabilitation

## 2024-11-19 VITALS — BP 163/87 | HR 60 | Resp 97 | Ht 63.0 in | Wt 124.6 lb

## 2024-11-19 DIAGNOSIS — M47816 Spondylosis without myelopathy or radiculopathy, lumbar region: Secondary | ICD-10-CM

## 2024-11-19 MED ORDER — LIDOCAINE HCL (PF) 2 % IJ SOLN
4.0000 mL | Freq: Once | INTRAMUSCULAR | Status: AC
  Administered 2024-11-19: 4 mL

## 2024-11-19 MED ORDER — LIDOCAINE HCL 1 % IJ SOLN
10.0000 mL | Freq: Once | INTRAMUSCULAR | Status: AC
  Administered 2024-11-19: 10 mL

## 2024-11-19 MED ORDER — IOHEXOL 180 MG/ML  SOLN
3.0000 mL | Freq: Once | INTRAMUSCULAR | Status: AC
  Administered 2024-11-19: 3 mL via ORAL

## 2024-11-19 NOTE — Progress Notes (Signed)
Bilateral Lumbar L3, L4  medial branch blocks and L 5 dorsal ramus injection under fluoroscopic guidance  Indication: Lumbar pain which is not relieved by medication management or other conservative care and interfering with self-care and mobility.  Informed consent was obtained after describing risks and benefits of the procedure with the patient, this includes bleeding, infection, paralysis and medication side effects.  The patient wishes to proceed and has given written consent.  The patient was placed in prone position.  The lumbar area was marked and prepped with Betadine.  One mL of 1% lidocaine was injected into each of 6 areas into the skin and subcutaneous tissue.  Then a 22-gauge 3.5 in  spinal needle was inserted targeting the junction of the left S1 superior articular process and sacral ala junction. Needle was advanced under fluoroscopic guidance.  Bone contact was made.Omnipaque 180 was injected x 0.5 mL demonstrating no intravascular uptake.  Then a solution  of 2% MPF lidocaine was injected x 0.5 mL.  Then the left L5 superior articular process in transverse process junction was targeted.  Bone contact was made. Omnipaque 180 was injected x 0.5 mL demonstrating no intravascular uptake. Then a solution containing  2% MPF lidocaine was injected x 0.5 mL.  Then the left L4 superior articular process in transverse process junction was targeted.  Bone contact was made. Omnipaque 180 was injected x 0.5 mL demonstrating no intravascular uptake.  Then a solution containing2% MPF lidocaine was injected x 0.5 mL.  This same procedure was performed on the right side using the same needle, technique and injectate.  Patient tolerated procedure well.  Post procedure instructions were given.   Lidocaine 1% with preservative multidose, 10ml no waste Lidocaine 2% MPF, 5ml bottle, 3ml used 2ml waste Omnipaque 180 1.5 ml used, 8.5 ml waste  

## 2024-11-19 NOTE — Patient Instructions (Signed)

## 2024-11-29 ENCOUNTER — Encounter: Payer: Self-pay | Admitting: Physician Assistant

## 2024-12-17 ENCOUNTER — Encounter: Attending: Physical Medicine & Rehabilitation | Admitting: Physical Medicine & Rehabilitation
